# Patient Record
Sex: Female | Born: 1960 | Race: White | Hispanic: No | Marital: Single | Smoking: Never smoker
Health system: Southern US, Community
[De-identification: ages and names within clinical notes are randomized; demographics above are authoritative.]

## PROBLEM LIST (undated history)

## (undated) DIAGNOSIS — Q211 Atrial septal defect: Secondary | ICD-10-CM

## (undated) DIAGNOSIS — F419 Anxiety disorder, unspecified: Secondary | ICD-10-CM

## (undated) DIAGNOSIS — M1990 Unspecified osteoarthritis, unspecified site: Secondary | ICD-10-CM

## (undated) DIAGNOSIS — N809 Endometriosis, unspecified: Secondary | ICD-10-CM

## (undated) DIAGNOSIS — Q225 Ebstein's anomaly: Secondary | ICD-10-CM

## (undated) DIAGNOSIS — Q2112 Patent foramen ovale: Secondary | ICD-10-CM

## (undated) HISTORY — DX: Atrial septal defect: Q21.1

## (undated) HISTORY — DX: Ebstein's anomaly: Q22.5

## (undated) HISTORY — DX: Endometriosis, unspecified: N80.9

## (undated) HISTORY — PX: BREAST BIOPSY: SHX20

## (undated) HISTORY — PX: BREAST EXCISIONAL BIOPSY: SUR124

## (undated) HISTORY — PX: LASER LAPAROSCOPY: SHX1952

## (undated) HISTORY — DX: Anxiety disorder, unspecified: F41.9

## (undated) HISTORY — DX: Patent foramen ovale: Q21.12

## (undated) HISTORY — DX: Unspecified osteoarthritis, unspecified site: M19.90

## (undated) HISTORY — PX: JOINT REPLACEMENT: SHX530

---

## 2017-10-01 LAB — BASIC METABOLIC PANEL
BUN: 12 (ref 4–21)
CREATININE: 0.9 (ref 0.5–1.1)
Glucose: 109
Potassium: 4.2 (ref 3.4–5.3)
Sodium: 143 (ref 137–147)

## 2017-10-01 LAB — HEPATIC FUNCTION PANEL
ALT: 19 (ref 7–35)
AST: 28 (ref 13–35)
Alkaline Phosphatase: 88 (ref 25–125)
Bilirubin, Total: 0.5

## 2017-10-01 LAB — HEMOGLOBIN A1C: Hemoglobin A1C: 5.9

## 2017-10-01 LAB — TSH: TSH: 1.28 (ref 0.41–5.90)

## 2017-11-13 LAB — HM COLONOSCOPY

## 2017-12-01 HISTORY — PX: WRIST SURGERY: SHX841

## 2018-02-26 LAB — HM MAMMOGRAPHY

## 2018-03-05 LAB — HM PAP SMEAR: HM Pap smear: NEGATIVE

## 2018-12-06 ENCOUNTER — Encounter: Payer: Self-pay | Admitting: Physician Assistant

## 2018-12-06 ENCOUNTER — Ambulatory Visit: Payer: 59 | Admitting: Physician Assistant

## 2018-12-06 VITALS — BP 122/80 | HR 76 | Temp 98.3°F | Ht 69.0 in | Wt 261.5 lb

## 2018-12-06 DIAGNOSIS — Q2112 Patent foramen ovale: Secondary | ICD-10-CM

## 2018-12-06 DIAGNOSIS — Q225 Ebstein's anomaly: Secondary | ICD-10-CM | POA: Diagnosis not present

## 2018-12-06 DIAGNOSIS — Q211 Atrial septal defect: Secondary | ICD-10-CM

## 2018-12-06 DIAGNOSIS — F419 Anxiety disorder, unspecified: Secondary | ICD-10-CM

## 2018-12-06 MED ORDER — CITALOPRAM HYDROBROMIDE 20 MG PO TABS: 20.0000 mg | ORAL_TABLET | Freq: Every day | ORAL | 1 refills | Status: DC

## 2018-12-06 NOTE — Progress Notes (Signed)
Susan Bender is a 58 y.o. female here to Establish Care.  I acted as a Neurosurgeonscribe for [employer] East CorporationSamantha Tacy Chavis, PA-C Susan Mullonna Orphanos, LPN  History of Present Illness:   Chief Complaint  Patient presents with  . Establish Care  . 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 Last echo was about 2-3 years ago [place]. Needs new cardiologist since she has moved here. Anxiety -- currently on celexa 20 mg. Feels like this is working well for her.  Health Maintenance: Immunizations -- UTD Colonoscopy -- done 10/2018 Normal, repeat 10 years. Mammogram -- done 01/2018 Normal PAP -- 03/05/2018 NILM, No Hx Abnormal paps Bone Density -- done 5-6 years ago normal per pt. Weight -- Weight: 261 lb 8 oz (118.6 kg)   Depression screen [facility] 12/06/2018  Decreased Interest 0  Down, Depressed, Hopeless 0  PHQ - 2 Score 0    GAD 7 : Generalized Anxiety Score 12/06/2018  Nervous, Anxious, on Edge 0  Control/stop worrying 0  Worry too much - different things 1  Trouble relaxing 0  Restless 0  Easily annoyed or irritable 0  Afraid - awful might happen 0  Total GAD 7 Score 1  Anxiety Difficulty Not difficult at all     Other providers/specialists: Patient Care Team: Susan MottoWorley, Susan Ansell, [place] as PCP - General (Physician Assistant)   Past Medical History:  Diagnosis Date  . Anxiety   . 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 Endometriosis   . PFO (patent foramen ovale)      Social History   Socioeconomic History  . Marital status: Not on file    Spouse name: Not on file  . Number of children: Not on file  . Years of education: Not on file  . Highest education level: Not on file  Occupational History  . Not on file  Social Needs  . Financial resource strain: Not on file  . Food insecurity:    Worry: Not on file    Inability: Not on file  . Transportation needs:    Medical: Not on file    Non-medical:  Not on file  Tobacco Use  . Smoking status: Never Smoker  . Smokeless tobacco: Never Used  Substance and Sexual Activity  . Alcohol use: Yes    Comment: occassional glass of wine  . Drug use: Never  . Sexual activity: Not Currently  Lifestyle  . Physical activity:    Days per week: Not on file    Minutes per session: Not on file  . Stress: Not on file  Relationships  . Social connections:    Talks on phone: Not on file    Gets together: Not on file    Attends religious service: Not on file    Active member of club or organization: Not on file    Attends meetings of clubs or organizations: Not on file    Relationship status: Not on file  . Intimate partner violence:    Fear of current or ex partner: Not on file    Emotionally abused: Not on file    Physically abused: Not on file    Forced sexual activity: Not on file  Other Topics Concern  . Not on file  Social History Narrative   Surgical tech at [employer]   Moved to [place] to be closer to mother and sister  No children    Past Surgical History:  Procedure Laterality Date  . LASER LAPAROSCOPY    . WRIST SURGERY Right 2019    History reviewed. No pertinent family history.  Allergies  Allergen Reactions  . Sulfa Antibiotics Hives     Current Medications:   Current Outpatient Medications:  .  cimetidine (TAGAMET) 200 MG tablet, Take 200 mg by mouth daily as needed., Disp: , Rfl:  .  citalopram (CELEXA) 20 MG tablet, Take 1 tablet (20 mg total) by mouth daily., Disp: 90 tablet, Rfl: 1 .  Multiple Vitamins-Minerals (ONE-A-DAY WOMENS VITACRAVES) CHEW, Chew 1 each by mouth daily., Disp: , Rfl:  .  naproxen sodium (ALEVE) 220 MG tablet, Take 220 mg by mouth as needed., Disp: , Rfl:    Review of Systems:   ROS  Negative unless otherwise specified per HPI.  Vitals:   Vitals:   12/06/18 0813  BP: 122/80  Pulse: 76  Temp: 98.3 F (36.8 C)  TempSrc: Oral  SpO2: 97%  Weight: 261 lb 8 oz (118.6 kg)  Height: 5\' 9"   (1.753 m)      Body mass index is 38.62 kg/m.  Physical Exam:   Physical Exam Vitals signs and nursing note reviewed.  Constitutional:      General: She is not in acute distress.    Appearance: She is well-developed. She is not ill-appearing or toxic-appearing.  Cardiovascular:     Rate and Rhythm: Normal rate and regular rhythm.     Pulses: Normal pulses.     Heart sounds: Normal heart sounds, S1 normal and S2 normal.     Comments: No LE edema Pulmonary:     Effort: Pulmonary effort is normal.     Breath sounds: Normal breath sounds.  Skin:    General: Skin is warm and dry.  Neurological:     Mental Status: She is alert.     GCS: GCS eye subscore is 4. GCS verbal subscore is 5. GCS motor subscore is 6.  Psychiatric:        Speech: Speech normal.        Behavior: Behavior normal. Behavior is cooperative.     No results found for this or any previous visit.  Assessment and Plan:   Susan Bender was seen today for establish care and annual exam.  Diagnoses and all orders for this visit:  Ebstein anomaly and PFO (patent foramen ovale) -     Ambulatory referral to Cardiology  Anxiety Refill Celexa.  Other orders -     citalopram (CELEXA) 20 MG tablet; Take 1 tablet (20 mg total) by mouth daily.  . Reviewed expectations re: course of current medical issues. . Discussed self-management of symptoms. . Outlined signs and symptoms indicating need for more acute intervention. . Patient verbalized understanding and all questions were answered. . See orders for this visit as documented in the electronic medical record. . Patient received an After-Visit Summary.  CMA or LPN served as scribe during this visit. History, Physical, and Plan performed by medical provider. The above documentation has been reviewed and is accurate and complete.  Susan MottoSamantha Tayla Panozzo, PA-C

## 2018-12-06 NOTE — Patient Instructions (Signed)
It was great to meet you!  Please make an appointment for a physical on your way out.  I have refilled your celexa.  You will be contacted about your cardiology referral.  Take care,  Jarold MottoSamantha Kathie Posa PA-C

## 2018-12-10 ENCOUNTER — Encounter: Payer: Self-pay | Admitting: Physical Therapy

## 2018-12-14 ENCOUNTER — Encounter: Payer: Self-pay | Admitting: Physician Assistant

## 2018-12-14 ENCOUNTER — Ambulatory Visit (INDEPENDENT_AMBULATORY_CARE_PROVIDER_SITE_OTHER): Payer: 59 | Admitting: Physician Assistant

## 2018-12-14 VITALS — BP 120/70 | HR 74 | Temp 98.1°F | Ht 69.0 in | Wt 262.0 lb

## 2018-12-14 DIAGNOSIS — Q2112 Patent foramen ovale: Secondary | ICD-10-CM

## 2018-12-14 DIAGNOSIS — E669 Obesity, unspecified: Secondary | ICD-10-CM

## 2018-12-14 DIAGNOSIS — Z1159 Encounter for screening for other viral diseases: Secondary | ICD-10-CM

## 2018-12-14 DIAGNOSIS — Z0000 Encounter for general adult medical examination without abnormal findings: Secondary | ICD-10-CM | POA: Diagnosis not present

## 2018-12-14 DIAGNOSIS — K219 Gastro-esophageal reflux disease without esophagitis: Secondary | ICD-10-CM | POA: Diagnosis not present

## 2018-12-14 DIAGNOSIS — Q225 Ebstein's anomaly: Secondary | ICD-10-CM | POA: Diagnosis not present

## 2018-12-14 DIAGNOSIS — F419 Anxiety disorder, unspecified: Secondary | ICD-10-CM | POA: Diagnosis not present

## 2018-12-14 DIAGNOSIS — Z136 Encounter for screening for cardiovascular disorders: Secondary | ICD-10-CM | POA: Diagnosis not present

## 2018-12-14 DIAGNOSIS — N809 Endometriosis, unspecified: Secondary | ICD-10-CM | POA: Insufficient documentation

## 2018-12-14 DIAGNOSIS — Z13220 Encounter for screening for lipoid disorders: Secondary | ICD-10-CM | POA: Diagnosis not present

## 2018-12-14 DIAGNOSIS — R7309 Other abnormal glucose: Secondary | ICD-10-CM | POA: Diagnosis not present

## 2018-12-14 DIAGNOSIS — Z114 Encounter for screening for human immunodeficiency virus [HIV]: Secondary | ICD-10-CM

## 2018-12-14 DIAGNOSIS — Q211 Atrial septal defect: Secondary | ICD-10-CM

## 2018-12-14 LAB — COMPREHENSIVE METABOLIC PANEL
ALT: 15 U/L (ref 0–35)
AST: 18 U/L (ref 0–37)
Albumin: 3.9 g/dL (ref 3.5–5.2)
Alkaline Phosphatase: 77 U/L (ref 39–117)
BILIRUBIN TOTAL: 0.5 mg/dL (ref 0.2–1.2)
BUN: 17 mg/dL (ref 6–23)
CO2: 26 mEq/L (ref 19–32)
Calcium: 9.3 mg/dL (ref 8.4–10.5)
Chloride: 107 mEq/L (ref 96–112)
Creatinine, Ser: 0.88 mg/dL (ref 0.40–1.20)
GFR: 70.06 mL/min (ref >60.00)
Glucose, Bld: 91 mg/dL (ref 70–99)
Potassium: 4.3 mEq/L (ref 3.5–5.1)
Sodium: 140 mEq/L (ref 135–145)
TOTAL PROTEIN: 6.6 g/dL (ref 6.0–8.3)

## 2018-12-14 LAB — LIPID PANEL
Cholesterol: 215 mg/dL — ABNORMAL HIGH (ref 0–200)
HDL: 60.3 mg/dL (ref >39.00)
LDL Cholesterol: 144 mg/dL — ABNORMAL HIGH (ref 0–99)
NonHDL: 154.78
Total CHOL/HDL Ratio: 4
Triglycerides: 54 mg/dL (ref 0.0–149.0)
VLDL: 10.8 mg/dL (ref 0.0–40.0)

## 2018-12-14 LAB — CBC WITH DIFFERENTIAL/PLATELET
Basophils Absolute: 0 10*3/uL (ref 0.0–0.1)
Basophils Relative: 1.2 % (ref 0.0–3.0)
Eosinophils Absolute: 0.1 10*3/uL (ref 0.0–0.7)
Eosinophils Relative: 3.3 % (ref 0.0–5.0)
HCT: 40.9 % (ref 36.0–46.0)
Hemoglobin: 13.7 g/dL (ref 12.0–15.0)
Lymphocytes Relative: 31.2 % (ref 12.0–46.0)
Lymphs Abs: 1.3 10*3/uL (ref 0.7–4.0)
MCHC: 33.6 g/dL (ref 30.0–36.0)
MCV: 92.1 fl (ref 78.0–100.0)
Monocytes Absolute: 0.3 10*3/uL (ref 0.1–1.0)
Monocytes Relative: 8.2 % (ref 3.0–12.0)
NEUTROS ABS: 2.4 10*3/uL (ref 1.4–7.7)
Neutrophils Relative %: 56.1 % (ref 43.0–77.0)
Platelets: 236 10*3/uL (ref 150.0–400.0)
RBC: 4.44 Mil/uL (ref 3.87–5.11)
RDW: 13.3 % (ref 11.5–15.5)
WBC: 4.2 10*3/uL (ref 4.0–10.5)

## 2018-12-14 LAB — HEMOGLOBIN A1C: Hgb A1c MFr Bld: 5.9 % (ref 4.6–6.5)

## 2018-12-14 NOTE — Patient Instructions (Signed)
It was great to see you!  Please go to the lab for blood work.   Our office will call you with your results unless you have chosen to receive results via MyChart.  If your blood work is normal we will follow-up each year for physicals and as scheduled for chronic medical problems.  If anything is abnormal we will treat accordingly and get you in for a follow-up.  Take care,  [facility] Maintenance, Female Adopting a healthy lifestyle and getting preventive care can go a long way to promote health and wellness. Talk with your health care provider about what schedule of regular examinations is right for you. This is a good chance for you to check in with your provider about disease prevention and staying healthy. In between checkups, there are plenty of things you can do on your own. Experts have done a lot of research about which lifestyle changes and preventive measures are most likely to keep you healthy. Ask your health care provider for more information. Weight and diet Eat a healthy diet  Be sure to include plenty of vegetables, fruits, low-fat dairy products, and lean protein.  Do not eat a lot of foods high in solid fats, added sugars, or salt.  Get regular exercise. This is one of the most important things you can do for your health. ? Most adults should exercise for at least 150 minutes each week. The exercise should increase your heart rate and make you sweat (moderate-intensity exercise). ? Most adults should also do strengthening exercises at least twice a week. This is in addition to the moderate-intensity exercise. Maintain a healthy weight  Body mass index (BMI) is a measurement that can be used to identify possible weight problems. It estimates body fat based on height and weight. Your health care provider can help determine your BMI and help you achieve or maintain a healthy weight.  For females 55 years of age and older: ? A BMI below 18.5 is considered  underweight. ? A BMI of 18.5 to 24.9 is normal. ? A BMI of 25 to 29.9 is considered overweight. ? A BMI of 30 and above is considered obese. Watch levels of cholesterol and blood lipids  You should start having your blood tested for lipids and cholesterol at 60 years of age, then have this test every 5 years.  You may need to have your cholesterol levels checked more often if: ? Your lipid or cholesterol levels are high. ? You are older than 59 years of age. ? You are at high risk for heart disease. Cancer screening Lung Cancer  Lung cancer screening is recommended for adults 60-73 years old who are at high risk for lung cancer because of a history of smoking.  A yearly low-dose CT scan of the lungs is recommended for people who: ? Currently smoke. ? Have quit within the past 15 years. ? Have at least a 30-pack-year history of smoking. A pack year is smoking an average of one pack of cigarettes a day for 1 year.  Yearly screening should continue until it has been 15 years since you quit.  Yearly screening should stop if you develop a health problem that would prevent you from having lung cancer treatment. Breast Cancer  Practice breast self-awareness. This means understanding how your breasts normally appear and feel.  It also means doing regular breast self-exams. Let your health care provider know about any changes, no matter how small.  If you are in  your 20s or 30s, you should have a clinical breast exam (CBE) by a health care provider every 1-3 years as part of a regular health exam.  If you are 40 or older, have a CBE every year. Also consider having a breast X-ray (mammogram) every year.  If you have a family history of breast cancer, talk to your health care provider about genetic screening.  If you are at high risk for breast cancer, talk to your health care provider about having an MRI and a mammogram every year.  Breast cancer gene (BRCA) assessment is recommended  for women who have family members with BRCA-related cancers. BRCA-related cancers include: ? Breast. ? Ovarian. ? Tubal. ? Peritoneal cancers.  Results of the assessment will determine the need for genetic counseling and BRCA1 and BRCA2 testing. Cervical Cancer Your health care provider may recommend that you be screened regularly for cancer of the pelvic organs (ovaries, uterus, and vagina). This screening involves a pelvic examination, including checking for microscopic changes to the surface of your cervix (Pap test). You may be encouraged to have this screening done every 3 years, beginning at age 21.  For women ages 30-65, health care providers may recommend pelvic exams and Pap testing every 3 years, or they may recommend the Pap and pelvic exam, combined with testing for human papilloma virus (HPV), every 5 years. Some types of HPV increase your risk of cervical cancer. Testing for HPV may also be done on women of any age with unclear Pap test results.  Other health care providers may not recommend any screening for nonpregnant women who are considered low risk for pelvic cancer and who do not have symptoms. Ask your health care provider if a screening pelvic exam is right for you.  If you have had past treatment for cervical cancer or a condition that could lead to cancer, you need Pap tests and screening for cancer for at least 20 years after your treatment. If Pap tests have been discontinued, your risk factors (such as having a new sexual partner) need to be reassessed to determine if screening should resume. Some women have medical problems that increase the chance of getting cervical cancer. In these cases, your health care provider may recommend more frequent screening and Pap tests. Colorectal Cancer  This type of cancer can be detected and often prevented.  Routine colorectal cancer screening usually begins at 59 years of age and continues through 59 years of age.  Your health  care provider may recommend screening at an earlier age if you have risk factors for colon cancer.  Your health care provider may also recommend using home test kits to check for hidden blood in the stool.  A small camera at the end of a tube can be used to examine your colon directly (sigmoidoscopy or colonoscopy). This is done to check for the earliest forms of colorectal cancer.  Routine screening usually begins at age 50.  Direct examination of the colon should be repeated every 5-10 years through 59 years of age. However, you may need to be screened more often if early forms of precancerous polyps or small growths are found. Skin Cancer  Check your skin from head to toe regularly.  Tell your health care provider about any new moles or changes in moles, especially if there is a change in a mole's shape or color.  Also tell your health care provider if you have a mole that is larger than the size of a pencil   eraser.  Always use sunscreen. Apply sunscreen liberally and repeatedly throughout the day.  Protect yourself by wearing long sleeves, pants, a wide-brimmed hat, and sunglasses whenever you are outside. Heart disease, diabetes, and high blood pressure  High blood pressure causes heart disease and increases the risk of stroke. High blood pressure is more likely to develop in: ? People who have blood pressure in the high end of the normal range (130-139/85-89 mm Hg). ? People who are overweight or obese. ? People who are African American.  If you are 68-37 years of age, have your blood pressure checked every 3-5 years. If you are 48 years of age or older, have your blood pressure checked every year. You should have your blood pressure measured twice-once when you are at a hospital or clinic, and once when you are not at a hospital or clinic. Record the average of the two measurements. To check your blood pressure when you are not at a hospital or clinic, you can use: ? An automated  blood pressure machine at a pharmacy. ? A home blood pressure monitor.  If you are between 92 years and 57 years old, ask your health care provider if you should take aspirin to prevent strokes.  Have regular diabetes screenings. This involves taking a blood sample to check your fasting blood sugar level. ? If you are at a normal weight and have a low risk for diabetes, have this test once every three years after 59 years of age. ? If you are overweight and have a high risk for diabetes, consider being tested at a younger age or more often. Preventing infection Hepatitis B  If you have a higher risk for hepatitis B, you should be screened for this virus. You are considered at high risk for hepatitis B if: ? You were born in a country where hepatitis B is common. Ask your health care provider which countries are considered high risk. ? Your parents were born in a high-risk country, and you have not been immunized against hepatitis B (hepatitis B vaccine). ? You have HIV or AIDS. ? You use needles to inject street drugs. ? You live with someone who has hepatitis B. ? You have had sex with someone who has hepatitis B. ? You get hemodialysis treatment. ? You take certain medicines for conditions, including cancer, organ transplantation, and autoimmune conditions. Hepatitis C  Blood testing is recommended for: ? Everyone born from [birth year] through 1966. ? Anyone with known risk factors for hepatitis C. Sexually transmitted infections (STIs)  You should be screened for sexually transmitted infections (STIs) including gonorrhea and chlamydia if: ? You are sexually active and are younger than 59 years of age. ? You are older than 59 years of age and your health care provider tells you that you are at risk for this type of infection. ? Your sexual activity has changed since you were last screened and you are at an increased risk for chlamydia or gonorrhea. Ask your health care provider if you are at  risk.  If you do not have HIV, but are at risk, it may be recommended that you take a prescription medicine daily to prevent HIV infection. This is called pre-exposure prophylaxis (PrEP). You are considered at risk if: ? You are sexually active and do not regularly use condoms or know the HIV status of your partner(s). ? You take drugs by injection. ? You are sexually active with a partner who has HIV. Talk with your health  care provider about whether you are at high risk of being infected with HIV. If you choose to begin PrEP, you should first be tested for HIV. You should then be tested every 3 months for as long as you are taking PrEP. Pregnancy  If you are premenopausal and you may become pregnant, ask your health care provider about preconception counseling.  If you may become pregnant, take 400 to 800 micrograms (mcg) of folic acid every day.  If you want to prevent pregnancy, talk to your health care provider about birth control (contraception). Osteoporosis and menopause  Osteoporosis is a disease in which the bones lose minerals and strength with aging. This can result in serious bone fractures. Your risk for osteoporosis can be identified using a bone density scan.  If you are 65 years of age or older, or if you are at risk for osteoporosis and fractures, ask your health care provider if you should be screened.  Ask your health care provider whether you should take a calcium or vitamin D supplement to lower your risk for osteoporosis.  Menopause may have certain physical symptoms and risks.  Hormone replacement therapy may reduce some of these symptoms and risks. Talk to your health care provider about whether hormone replacement therapy is right for you. Follow these instructions at home:  Schedule regular health, dental, and eye exams.  Stay current with your immunizations.  Do not use any tobacco products including cigarettes, chewing tobacco, or electronic  cigarettes.  If you are pregnant, do not drink alcohol.  If you are breastfeeding, limit how much and how often you drink alcohol.  Limit alcohol intake to no more than 1 drink per day for nonpregnant women. One drink equals 12 ounces of beer, 5 ounces of wine, or 1 ounces of hard liquor.  Do not use street drugs.  Do not share needles.  Ask your health care provider for help if you need support or information about quitting drugs.  Tell your health care provider if you often feel depressed.  Tell your health care provider if you have ever been abused or do not feel safe at home. This information is not intended to replace advice given to you by your health care provider. Make sure you discuss any questions you have with your health care provider. Document Released: 06/02/2011 Document Revised: 04/24/2016 Document Reviewed: 08/21/2015 [employer]vier Interactive Patient Education  2019 [employer]  

## 2018-12-14 NOTE — Progress Notes (Signed)
I acted as a Neurosurgeonscribe for Energy East CorporationSamantha Chenise Mulvihill, PA-C Susan Mullonna Orphanos, LPN   Subjective:    Susan RuddyKimberly J Bender is a 58 y.o. female and is here for a comprehensive physical exam.   HPI  Health Maintenance Due  Topic Date Due  . Hepatitis C Screening  1961-03-31  . HIV Screening  08/01/1975  . COLONOSCOPY  07/31/2010    Acute Concerns: None  Chronic Issues: Ebstein anomaly/PFO -- has new patient cardiology appointment next month; denies chest pain, SOB, palpitations or other concerns presently Anxiety -- currently well controlled on Celexa 20 mg GERD -- takes tagamet 200 mg daily, overall well controlled Insulin resistance/obesity -- hx of HgbA1c of 5.9. Does have family hx DM. Has had weight gain. Reluctant to try metformin  Health Maintenance: Immunizations -- UTD Colonoscopy -- UTD, awaiting copy of results Mammogram -- UTD, awaiting copy of results PAP -- UTD, awaiting copy of results Bone Density -- n/a Diet -- doesn't eat well, tries to eat all food groups Sleep habits -- fair, no concerns for sleep apnea presently Exercise -- used to do yoga, but currently not enrolled in anything Weight -- Weight: 262 lb (118.8 kg) ; her highest weight ever Mood -- good Weight history: Wt Readings from Last 10 Encounters:  12/14/18 262 lb (118.8 kg)  12/06/18 261 lb 8 oz (118.6 kg)   No LMP recorded. Patient is postmenopausal. Period characteristics: none Alcohol use: none concerning Tobacco use: none  Depression screen PHQ 2/9 12/06/2018  Decreased Interest 0  Down, Depressed, Hopeless 0  PHQ - 2 Score 0     Other providers/specialists: Patient Care Team: Jarold MottoWorley, Susan Herter, [place] as PCP - General (Physician Assistant)    PMHx, SurgHx, SocialHx, Medications, and Allergies were reviewed in the Visit Navigator and updated as appropriate.   Past Medical History:  Diagnosis Date  . Anxiety   . 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 Endometriosis   . PFO (patent foramen  ovale)      Past Surgical History:  Procedure Laterality Date  . LASER LAPAROSCOPY    . WRIST SURGERY Right 2019     Family History  Problem Relation Age of Onset  . Arthritis Mother   . Alzheimer's disease Mother   . Diabetes Father   . Heart attack Father   . Heart disease Father   . Hypertension Father   . Parkinson's disease Father   . COPD Sister   . Diabetes Sister   . Hypertension Sister   . Heart attack Paternal Grandfather   . Heart disease Paternal Grandfather   . Asthma Sister     Social History   Tobacco Use  . Smoking status: Never Smoker  . Smokeless tobacco: Never Used  Substance Use Topics  . Alcohol use: Yes    Comment: occassional glass of wine  . Drug use: Never    Review of Systems:   Review of Systems  Constitutional: Negative.  Negative for chills, fever, malaise/fatigue and weight loss.  HENT: Negative.  Negative for hearing loss, sinus pain and sore throat.   Eyes: Negative.  Negative for blurred vision.  Respiratory: Negative.  Negative for cough and shortness of breath.   Cardiovascular: Negative.  Negative for chest pain, palpitations and leg swelling.  Gastrointestinal: Negative.  Negative for abdominal pain, constipation, diarrhea, heartburn, nausea and vomiting.  Genitourinary: Negative.  Negative for dysuria, frequency and urgency.  Musculoskeletal: Positive for back pain (Chronic). Negative for myalgias and neck  pain.  Skin: Negative.  Negative for itching and rash.  Neurological: Negative.  Negative for dizziness, tingling, seizures, loss of consciousness and headaches.  Endo/Heme/Allergies: Negative for polydipsia.  Psychiatric/Behavioral: Negative.  Negative for depression. The patient is not nervous/anxious.     Objective:   BP 120/70 (BP Location: Left Arm, Patient Position: Sitting, Cuff Size: Large)   Pulse 74   Temp 98.1 F (36.7 C) (Oral)   Ht 5\' 9"  (1.753 m)   Wt 262 lb (118.8 kg)   SpO2 95%   BMI 38.69 kg/m    Body mass index is 38.69 kg/m.   General Appearance:    Alert, cooperative, no distress, appears stated age  Head:    Normocephalic, without obvious abnormality, atraumatic  Eyes:    PERRL, conjunctiva/corneas clear, EOM's intact, fundi    benign, both eyes  Ears:    Normal TM's and external ear canals, both ears  Nose:   Nares normal, septum midline, mucosa normal, no drainage    or sinus tenderness  Throat:   Lips, mucosa, and tongue normal; teeth and gums normal  Neck:   Supple, symmetrical, trachea midline, no adenopathy;    thyroid:  no enlargement/tenderness/nodules; no carotid   bruit or JVD  Back:     Symmetric, no curvature, ROM normal, no CVA tenderness  Lungs:     Clear to auscultation bilaterally, respirations unlabored  Chest Wall:    No tenderness or deformity   Heart:    Regular rate and rhythm, S1 and S2 normal, no murmur, rub or gallop  Breast Exam:    No tenderness, masses, or nipple abnormality  Abdomen:     Soft, non-tender, bowel sounds active all four quadrants,    no masses, no organomegaly  Genitalia:    Deferred  Extremities:   Extremities normal, atraumatic, no cyanosis or edema  Pulses:   2+ and symmetric all extremities  Skin:   Skin color, texture, turgor normal, no rashes or lesions  Lymph nodes:   Cervical, supraclavicular, and axillary nodes normal  Neurologic:   CNII-XII intact, normal strength, sensation and reflexes    throughout    Assessment/Plan:   Susan Bender was seen today for annual exam.  Diagnoses and all orders for this visit:  Routine physical examination Today patient counseled on age appropriate routine health concerns for screening and prevention, each reviewed and up to date or declined. Immunizations reviewed and up to date or declined. Labs ordered and reviewed. Risk factors for depression reviewed and negative. Hearing function and visual acuity are intact. ADLs screened and addressed as needed. Functional ability and level of  safety reviewed and appropriate. Education, counseling and referrals performed based on assessed risks today. Patient provided with a copy of personalized plan for preventive services.  Anxiety Currently on Celexa 20 mg. Stable. Follow-up in 6 months, sooner if needed. -     CBC with Differential/Platelet -     Comprehensive metabolic panel  Obesity (BMI 30-39.9) and Elevated glucose Encouraged weight loss. Offered nutrition counseling, she declined. She will consider metformin if HgbA1c remains elevated.  -     Hemoglobin A1c  PFO (patent foramen ovale) and Ebstein anomaly Upcoming cardiology appointment pending. Currently asymptomatic.  Gastroesophageal reflux disease, esophagitis presence not specified Currently well controlled with Tagamet 200 mg.  Encounter for lipid screening for cardiovascular disease -     Lipid panel  Encounter for screening for other viral diseases -     Hepatitis C antibody  Screening for  HIV (human immunodeficiency virus) -     HIV Antibody (routine testing w rflx)   Well Adult Exam: Labs ordered: Yes. Patient counseling was done. See below for items discussed. Discussed the patient's BMI.  The BMI is not in the acceptable range; BMI management plan is completed Follow up pending results. Breast cancer screening: UTD. Cervical cancer screening: UTD   Patient Counseling: [x]    Nutrition: Stressed importance of moderation in sodium/caffeine intake, saturated fat and cholesterol, caloric balance, sufficient intake of fresh fruits, vegetables, fiber, calcium, iron, and 1 mg of folate supplement per day (for females capable of pregnancy).  [x]    Stressed the importance of regular exercise.   [x]    Substance Abuse: Discussed cessation/primary prevention of tobacco, alcohol, or other drug use; driving or other dangerous activities under the influence; availability of treatment for abuse.   [x]    Injury prevention: Discussed safety belts, safety helmets, smoke  detector, smoking near bedding or upholstery.   []    Sexuality: Discussed sexually transmitted diseases, partner selection, use of condoms, avoidance of unintended pregnancy  and contraceptive alternatives.  [x]    Dental health: Discussed importance of regular tooth brushing, flossing, and dental visits.  [x]    Health maintenance and immunizations reviewed. Please refer to Health maintenance section.   CMA or LPN served as scribe during this visit. History, Physical, and Plan performed by medical provider. The above documentation has been reviewed and is accurate and complete.  Jarold Motto, PA-C [facility]

## 2018-12-15 LAB — HEPATITIS C ANTIBODY
Hepatitis C Ab: NONREACTIVE
SIGNAL TO CUT-OFF: 0.01 (ref <1.00)

## 2018-12-15 LAB — HIV ANTIBODY (ROUTINE TESTING W REFLEX): HIV 1&2 Ab, 4th Generation: NONREACTIVE

## 2018-12-29 ENCOUNTER — Encounter: Payer: Self-pay | Admitting: Physician Assistant

## 2018-12-30 ENCOUNTER — Ambulatory Visit: Payer: 59 | Admitting: Cardiology

## 2019-01-04 DIAGNOSIS — H5213 Myopia, bilateral: Secondary | ICD-10-CM | POA: Diagnosis not present

## 2019-01-04 DIAGNOSIS — H524 Presbyopia: Secondary | ICD-10-CM | POA: Diagnosis not present

## 2019-01-04 DIAGNOSIS — H18413 Arcus senilis, bilateral: Secondary | ICD-10-CM | POA: Diagnosis not present

## 2019-01-04 DIAGNOSIS — H52223 Regular astigmatism, bilateral: Secondary | ICD-10-CM | POA: Diagnosis not present

## 2019-01-18 ENCOUNTER — Ambulatory Visit: Payer: 59 | Admitting: Cardiology

## 2019-01-25 ENCOUNTER — Encounter: Payer: Self-pay | Admitting: Cardiology

## 2019-01-25 ENCOUNTER — Ambulatory Visit: Payer: 59 | Admitting: Cardiology

## 2019-01-25 VITALS — BP 118/84 | HR 80 | Ht 70.0 in | Wt 267.4 lb

## 2019-01-25 DIAGNOSIS — Z6838 Body mass index (BMI) 38.0-38.9, adult: Secondary | ICD-10-CM

## 2019-01-25 DIAGNOSIS — Q225 Ebstein's anomaly: Secondary | ICD-10-CM

## 2019-01-25 DIAGNOSIS — Z7189 Other specified counseling: Secondary | ICD-10-CM | POA: Diagnosis not present

## 2019-01-25 DIAGNOSIS — Q211 Atrial septal defect: Secondary | ICD-10-CM

## 2019-01-25 DIAGNOSIS — Z713 Dietary counseling and surveillance: Secondary | ICD-10-CM

## 2019-01-25 DIAGNOSIS — Q2112 Patent foramen ovale: Secondary | ICD-10-CM

## 2019-01-25 DIAGNOSIS — E6609 Other obesity due to excess calories: Secondary | ICD-10-CM

## 2019-01-25 NOTE — Patient Instructions (Signed)

## 2019-01-25 NOTE — Progress Notes (Signed)
Cardiology Office Note:    Date:  01/25/2019   ID:  Susan, Bender 09/04/1961, MRN [id number]  PCP:  Jarold Motto, PA  Cardiologist:  Jodelle Red, MD PhD  Referring MD: Jarold Motto, [place]   CC. Establish care  History of Present Illness:    Susan Bender is a 58 y.o. female with a hx of Ebstein's anomaly who is seen as a new consult at the request of Jarold Motto, [place] for the evaluation and management of Ebstein's.  I received a copy of a clinic note from 06/06/2016 (Dr. Griffith Citron), and I also requested her [facility] records from Care Everywhere.   Reports that she had an echo at her last visit with Dr. Griffith Citron in [place], [place] in 2017. Will request records of this, as they are not included. Had her sign the release today.   Overall doing well. Walks routinely, thought notes that it's "not enough." Gets some shortness of breath on occasion, only with exertion. Denies chest pain, shortness of breath at rest. No PND, orthopnea, or unexpected weight gain. No syncope, rare palpitations, worse with caffeine. Rare LE edema, worse when on her feet all day.  Cardiac history: Diagnosed in her 1s, no prior cardiac history. 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 Checked on an ECG, saw fast rhythm, went to see provider. Had holter monitor placed and was placed on medication by an NP. (per Dr. Orie Rout notes, Holter showed isolated PACs and PVCs, was placed on beta blocker). Felt terrible on medication, referred to Dr. Griffith Citron in [place], [place]. Had normal stress test, then had echo that showed Ebstein's. Was referred to Dr. Deatra James at the [facility], has one visit there. Was followed annually by Dr. Griffith Citron with echo every 3-5 years.   Past Medical History:  Diagnosis Date  . Anxiety   . Ebstein anomaly    diagnosed in age 9's  . Endometriosis   . PFO (patent foramen ovale)     Past  Surgical History:  Procedure Laterality Date  . LASER LAPAROSCOPY    . WRIST SURGERY Right 2019    Current Medications: Current Outpatient Medications on File Prior to Visit  Medication Sig  . citalopram (CELEXA) 20 MG tablet Take 1 tablet (20 mg total) by mouth daily.  . Multiple Vitamins-Minerals (ONE-A-DAY WOMENS VITACRAVES) CHEW Chew 1 each by mouth daily.  . cimetidine (TAGAMET) 200 MG tablet Take 200 mg by mouth daily as needed.  . naproxen sodium (ALEVE) 220 MG tablet Take 220 mg by mouth as needed.   No current facility-administered medications on file prior to visit.      Allergies:   Sulfa antibiotics   Social History   Socioeconomic History  . Marital status: Single    Spouse name: Not on file  . Number of children: Not on file  . Years of education: Not on file  . Highest education level: Not on file  Occupational History  . Not on file  Social Needs  . Financial resource strain: Not on file  . Food insecurity:    Worry: Not on file    Inability: Not on file  . Transportation needs:    Medical: Not on file    Non-medical: Not on file  Tobacco Use  . Smoking status: Never Smoker  . Smokeless tobacco: Never Used  Substance and Sexual Activity  . Alcohol use: Yes  Comment: occassional glass of wine  . Drug use: Never  . Sexual activity: Not Currently  Lifestyle  . Physical activity:    Days per week: Not on file    Minutes per session: Not on file  . Stress: Not on file  Relationships  . Social connections:    Talks on phone: Not on file    Gets together: Not on file    Attends religious service: Not on file    Active member of club or organization: Not on file    Attends meetings of clubs or organizations: Not on file    Relationship status: Not on file  Other Topics Concern  . 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 Dad had MI and CABG in his late 79s.   ROS:   Please see the history of present illness.  Additional pertinent ROS:  Constitutional: Negative for chills, fever, night sweats, unintentional weight loss  HENT: Negative for ear pain and hearing loss.   Eyes: Negative for loss of vision and eye pain.  Respiratory: Negative for cough, sputum, wheezing.   Cardiovascular: See HPI. Gastrointestinal: Negative for abdominal pain, melena, and hematochezia.  Genitourinary: Negative for dysuria and hematuria.  Musculoskeletal: Negative for falls and myalgias.  Skin: Negative for itching and rash.  Neurological: Negative for focal weakness, focal sensory changes and loss of consciousness.  Endo/Heme/Allergies: Does not bruise/bleed easily.    EKGs/Labs/Other Studies Reviewed:    The following studies were reviewed today: Echo from [facility] 2006: FINDINGS LEFT VENTRICLE -  EF = 55 +/- 5%. The left ventricle appears normal in size. Left ventricular systolic function is normal. RIGHT VENTRICLE - The right ventricle is normal in size and systolic function. LEFT ATRIUM/PULMONARY VEINS - The left atrium is normal. The left atrial cavity appears normal in size. The left atrial cavity area is 19.0 cm during systole. RIGHT ATRIUM/IVC/SVC - The right atrium is normal. MITRAL VALVE - The mitral valve is normal. TRICUSPID VALVE -  There is 2+ tricuspid regurgitation. Regurgitant velocity is 270 cm/s and estimated RV systolic pressure is 34 mm Hg. AORTIC VALVE/LVOT - This is a tricuspid aortic valve. There is 1+ aortic regurgitation. PULMONIC VALVE - The pulmonic valve is  normal. AORTA - The aorta is normal. PULMONARY ARTERIES - The pulmonary arteries are normal. IAS/IVS - The interatrial and interventricular septa are normal. There is right to left flow through the interatrial septum PERICARDIUM - The pericardium is normal.  CONCLUSIONS 1. Normal LV size and function  2. Normal RV function 3. 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 Mild aortic regurgitation. 5. Positive saline microcavitation study but bubbles appear in the LV after 3  cardiac cycles, more suggestive of intrapulmonary shunt.  EKG:  EKG is personally reviewed.  The ekg ordered today demonstrates normal sinus rhythm, no delta wave.  Recent Labs: 12/14/2018: ALT 15; BUN 17; Creatinine, Ser 0.88; Hemoglobin 13.7; Platelets 236.0; Potassium 4.3; Sodium 140  Recent Lipid Panel  Component Value Date/Time   CHOL 215 (H) 12/14/2018 0821   TRIG 54.0 12/14/2018 0821   HDL 60.30 12/14/2018 0821   CHOLHDL 4 12/14/2018 0821   VLDL 10.8 12/14/2018 0821   LDLCALC 144 (H) 12/14/2018 0821    Physical Exam:    VS:  BP 118/84 (BP Location: Right Arm)   Pulse 80   Ht 5\' 10"  (1.778 m)   Wt 267 lb 6.4 oz (121.3 kg)   SpO2 97%   BMI 38.37 kg/m     Wt Readings from Last 3 Encounters:  01/25/19 267 lb 6.4 oz (121.3 kg)  12/14/18 262 lb (118.8 kg)  12/06/18 261 lb 8 oz (118.6 kg)     GEN: Well nourished, well developed in no acute distress HEENT: Normal NECK: JVD about 7-8 cm, no HJR. No carotid bruits LYMPHATICS: No lymphadenopathy CARDIAC: regular rhythm, normal S1 and S2, no rubs, gallops. 1/6 HSM at LSB. Radial and DP pulses 2+ bilaterally. RESPIRATORY:  Clear to auscultation without rales, wheezing or rhonchi  ABDOMEN: Soft, non-tender, non-distended MUSCULOSKELETAL:  No edema; No deformity  SKIN: Warm and dry NEUROLOGIC:  Alert and oriented x 3 PSYCHIATRIC:  Normal affect   ASSESSMENT:    1. PFO (patent foramen ovale)   2. Ebstein anomaly    3. Class 2 obesity due to excess calories without serious comorbidity with body mass index (BMI) of 38.0 to 38.9 in adult   4. Cardiac risk counseling   5. Nutritional counseling   6. Counseling on health promotion and disease prevention    PLAN:    Ebstein's anomaly: per available notes, mild variant. Has not limited her. -no clinical symptoms of heart failure -requesting records for most recent RV function. Prior echo in 2006 was normal RV function -not doing much exercise, but has not felt limited by symptoms. Monitor -no history of cyanosis or paradoxical embolism -reported flow across IAS in 2006, though bubble study suggest intrapulmonary shunt. Possible PFO. -no significant arrhythmia seen on prior monitor evaluation. No delta wave on ECG. Palpitations improved since cutting back on caffeine  Primary prevention: -recommend heart healthy/Mediterranean diet, with whole grains, fruits, vegetable, fish, lean meats, nuts, and olive oil. Limit salt. -recommend moderate walking, 3-5 times/week for 30-50 minutes each session. Aim for at least 150 minutes.week. Goal should be pace of 3 miles/hours, or walking 1.5 miles in 30 minutes -recommend avoidance of tobacco products. Avoid excess alcohol. -Additional risk factor control:  -Diabetes: A1c is 5.9, working on lifestyle modification, no formal diagnosis yet of diabetes  -Lipids: Tchol 215, HDL 60, LDL 144, TG 54. Wants to modify with lifestyle.  -Blood pressure control: systolic at goal, diastolic mildly elevated, working on lifestyle  -Weight: BMI 38. Working on weight loss. Admits poor diet and no intentional exercise. Working to modify this. -ASCVD risk score: The 10-year ASCVD risk score Denman George(Goff DC Montez HagemanJr., et al., 2013) is: 2.3%   Values used to calculate the score:     Age: [age] years     Sex: Female     Is Non-Hispanic African American: No     Diabetic: No     Tobacco smoker: No     Systolic Blood Pressure: 118 mmHg     Is BP  treated: No     HDL Cholesterol: 60.3 mg/dL     Total Cholesterol: 215 mg/dL   Plan for follow up: 1 year or sooner PRN  Medication Adjustments/Labs and Tests Ordered: Current medicines are reviewed at length with the  patient today.  Concerns regarding medicines are outlined above.  Orders Placed This Encounter  Procedures  . EKG 12-Lead   No orders of the defined types were placed in this encounter.   Patient Instructions  Medication Instructions:  Your Physician recommend you continue on your current medication as directed.    If you need a refill on your cardiac medications before your next appointment, please call your pharmacy.   Lab work: None  Testing/Procedures: None  Follow-Up: At [facility], you and your health needs are our priority.  As part of our continuing mission to provide you with exceptional heart care, we have created designated Provider Care Teams.  These Care Teams include your primary Cardiologist (physician) and Advanced Practice Providers (APPs -  Physician Assistants and Nurse Practitioners) who all work together to provide you with the care you need, when you need it. You will need a follow up appointment in 1 years.  Please call our office 2 months in advance to schedule this appointment.  You may see Dr. Cristal Deer or one of the following Advanced Practice Providers on your designated Care Team:   Theodore Demark, PA-C . Joni Reining, DNP, ANP      Signed, Jodelle Red, MD PhD 01/25/2019 3:33 PM    [facility]

## 2019-04-26 ENCOUNTER — Other Ambulatory Visit: Payer: Self-pay | Admitting: Physician Assistant

## 2019-04-26 DIAGNOSIS — Z1231 Encounter for screening mammogram for malignant neoplasm of breast: Secondary | ICD-10-CM

## 2019-05-28 ENCOUNTER — Other Ambulatory Visit: Payer: Self-pay

## 2019-05-28 ENCOUNTER — Ambulatory Visit
Admission: RE | Admit: 2019-05-28 | Discharge: 2019-05-28 | Disposition: A | Discharge status: Home / Self Care | Payer: 59 | Source: Ambulatory Visit | Attending: Physician Assistant | Admitting: Physician Assistant

## 2019-05-28 DIAGNOSIS — Z1231 Encounter for screening mammogram for malignant neoplasm of breast: Secondary | ICD-10-CM | POA: Diagnosis not present

## 2019-06-12 ENCOUNTER — Encounter: Payer: Self-pay | Admitting: Physician Assistant

## 2019-06-13 NOTE — Telephone Encounter (Signed)
I spoke with patient and she is on the schedule for a Doxy[facility]me appointment with Sam, this Friday.

## 2019-06-13 NOTE — Telephone Encounter (Signed)
Left message on personal voicemail to call office and schedule virtual visit with [facility] for medication refill. Also sent My Chart message.

## 2019-06-17 ENCOUNTER — Ambulatory Visit (INDEPENDENT_AMBULATORY_CARE_PROVIDER_SITE_OTHER): Payer: 59 | Admitting: Physician Assistant

## 2019-06-17 ENCOUNTER — Encounter: Payer: Self-pay | Admitting: Physician Assistant

## 2019-06-17 VITALS — BP 126/78 | HR 79 | Temp 97.5°F

## 2019-06-17 DIAGNOSIS — F419 Anxiety disorder, unspecified: Secondary | ICD-10-CM

## 2019-06-17 MED ORDER — CITALOPRAM HYDROBROMIDE 20 MG PO TABS: 20.0000 mg | ORAL_TABLET | Freq: Every day | ORAL | 3 refills | Status: DC

## 2019-06-17 NOTE — Progress Notes (Signed)
Virtual Visit via Video   I connected with Susan Bender on 06/17/19 at  8:40 AM EDT by a video enabled telemedicine application and verified that I am speaking with the correct person using two identifiers. Location patient: Home Location provider: [facility], Office Persons participating in the virtual visit: Susan Bender, Chrisette Man PA-C.   I discussed the limitations of evaluation and management by telemedicine and the availability of in person appointments. The patient expressed understanding and agreed to proceed.  I acted as a Neurosurgeonscribe for Energy East CorporationSamantha Ladajah Soltys, PA-C Alishah-ClarkDonna Orphanos, LPN  Subjective:   HPI:   Anxiety Pt following up today for anxiety, currently taking Celexa 20 mg daily, complaint with medication and working well. Denies SI/HI. She is happy with her medication and would like to continue this dosage.  GAD 7 : Generalized Anxiety Score 06/17/2019 12/06/2018  Nervous, Anxious, on Edge 0 0  Control/stop worrying 0 0  Worry too much - different things 1 1  Trouble relaxing 0 0  Restless 0 0  Easily annoyed or irritable 1 0  Afraid - awful might happen 0 0  Total GAD 7 Score 2 1  Anxiety Difficulty Not difficult at all Not difficult at all          ROS: See pertinent positives and negatives per HPI.  Patient Active Problem List   Diagnosis Date Noted  . Obesity (BMI 30-39.9) 12/14/2018  . Anxiety 12/14/2018  . PFO (patent foramen ovale)   . Endometriosis   . Ebstein anomaly     Social History   Tobacco Use  . Smoking status: Never Smoker  . Smokeless tobacco: Never Used  Substance Use Topics  . Alcohol use: Yes    Comment: occassional glass of wine    Current Outpatient Medications:  .  cimetidine (TAGAMET) 200 MG tablet, Take 200 mg by mouth daily as needed., Disp: , Rfl:  .  citalopram (CELEXA) 20 MG tablet, Take 1 tablet (20 mg total) by mouth daily., Disp: 90 tablet, Rfl: 3 .  Multiple Vitamins-Minerals (ONE-A-DAY WOMENS  VITACRAVES) CHEW, Chew 1 each by mouth daily., Disp: , Rfl:  .  naproxen sodium (ALEVE) 220 MG tablet, Take 220 mg by mouth as needed., Disp: , Rfl:   Allergies  Allergen Reactions  . Sulfa Antibiotics Hives and Rash    Objective:   VITALS: Per patient if applicable, see vitals. GENERAL: Alert, appears well and in no acute distress. HEENT: Atraumatic, conjunctiva clear, no obvious abnormalities on inspection of external nose and ears. NECK: Normal movements of the head and neck. CARDIOPULMONARY: No increased WOB. Speaking in clear sentences. I:E ratio WNL.  MS: Moves all visible extremities without noticeable abnormality. PSYCH: Pleasant and cooperative, well-groomed. Speech normal rate and rhythm. Affect is appropriate. Insight and judgement are appropriate. Attention is focused, linear, and appropriate.  NEURO: CN grossly intact. Oriented as arrived to appointment on time with no prompting. Moves both UE equally.  SKIN: No obvious lesions, wounds, erythema, or cyanosis noted on face or hands.  Assessment and Plan:   Susan Bender was seen today for anxiety.  Diagnoses and all orders for this visit:  Anxiety  Other orders -     citalopram (CELEXA) 20 MG tablet; Take 1 tablet (20 mg total) by mouth daily.   Well controlled. Continue current regimen. Follow-up in 1 year, sooner if issues.  . Reviewed expectations re: course of current medical issues. . Discussed self-management of symptoms. . Outlined signs and symptoms indicating  need for more acute intervention. . Patient verbalized understanding and all questions were answered. Marland Kitchen Health Maintenance issues including appropriate healthy diet, exercise, and smoking avoidance were discussed with patient. . See orders for this visit as documented in the electronic medical record.  I discussed the assessment and treatment plan with the patient. The patient was provided an opportunity to ask questions and all were answered. The patient  agreed with the plan and demonstrated an understanding of the instructions.   The patient was advised to call back or seek an in-person evaluation if the symptoms worsen or if the condition fails to improve as anticipated.   CMA or LPN served as scribe during this visit. History, Physical, and Plan performed by medical provider. The above documentation has been reviewed and is accurate and complete.   [place], [place] 06/17/2019

## 2019-08-25 ENCOUNTER — Ambulatory Visit (INDEPENDENT_AMBULATORY_CARE_PROVIDER_SITE_OTHER): Payer: 59

## 2019-08-25 ENCOUNTER — Other Ambulatory Visit: Payer: Self-pay

## 2019-08-25 DIAGNOSIS — Z23 Encounter for immunization: Secondary | ICD-10-CM

## 2019-08-25 NOTE — Patient Instructions (Signed)
Health Maintenance Due  Topic Date Due  . INFLUENZA VACCINE  07/02/2019    Depression screen PHQ 2/9 12/06/2018  Decreased Interest 0  Down, Depressed, Hopeless 0  PHQ - 2 Score 0

## 2019-09-13 ENCOUNTER — Encounter: Payer: Self-pay | Admitting: Physician Assistant

## 2020-01-19 ENCOUNTER — Ambulatory Visit: Payer: 59 | Admitting: Cardiology

## 2020-01-23 ENCOUNTER — Ambulatory Visit (INDEPENDENT_AMBULATORY_CARE_PROVIDER_SITE_OTHER): Payer: 59 | Admitting: Cardiology

## 2020-01-23 ENCOUNTER — Encounter: Payer: Self-pay | Admitting: Cardiology

## 2020-01-23 ENCOUNTER — Other Ambulatory Visit: Payer: Self-pay

## 2020-01-23 VITALS — BP 122/70 | HR 81 | Ht 70.0 in | Wt 265.0 lb

## 2020-01-23 DIAGNOSIS — Q2112 Patent foramen ovale: Secondary | ICD-10-CM

## 2020-01-23 DIAGNOSIS — Z6838 Body mass index (BMI) 38.0-38.9, adult: Secondary | ICD-10-CM | POA: Diagnosis not present

## 2020-01-23 DIAGNOSIS — H524 Presbyopia: Secondary | ICD-10-CM | POA: Diagnosis not present

## 2020-01-23 DIAGNOSIS — Q225 Ebstein's anomaly: Secondary | ICD-10-CM

## 2020-01-23 DIAGNOSIS — Q211 Atrial septal defect: Secondary | ICD-10-CM | POA: Diagnosis not present

## 2020-01-23 DIAGNOSIS — H5213 Myopia, bilateral: Secondary | ICD-10-CM | POA: Diagnosis not present

## 2020-01-23 DIAGNOSIS — H18413 Arcus senilis, bilateral: Secondary | ICD-10-CM | POA: Diagnosis not present

## 2020-01-23 DIAGNOSIS — Z7189 Other specified counseling: Secondary | ICD-10-CM | POA: Diagnosis not present

## 2020-01-23 DIAGNOSIS — H52223 Regular astigmatism, bilateral: Secondary | ICD-10-CM | POA: Diagnosis not present

## 2020-01-23 DIAGNOSIS — E6609 Other obesity due to excess calories: Secondary | ICD-10-CM | POA: Diagnosis not present

## 2020-01-23 NOTE — Patient Instructions (Signed)
Medication Instructions:  Your Physician recommend you continue on your current medication as directed.    *If you need a refill on your cardiac medications before your next appointment, please call your pharmacy*  Lab Work: None  Testing/Procedures: Your physician has requested that you have an echocardiogram in 1 year. Echocardiography is a painless test that uses sound waves to create images of your heart. It provides your doctor with information about the size and shape of your heart and how well your heart's chambers and valves are working. This procedure takes approximately one hour. There are no restrictions for this procedure. [address]   Follow-Up: At [facility], you and your health needs are our priority.  As part of our continuing mission to provide you with exceptional heart care, we have created designated Provider Care Teams.  These Care Teams include your primary Cardiologist (physician) and Advanced Practice Providers (APPs -  Physician Assistants and Nurse Practitioners) who all work together to provide you with the care you need, when you need it.  Your next appointment:   1 year(s)  The format for your next appointment:   In Person  Provider:   Jodelle Red, MD

## 2020-01-23 NOTE — Progress Notes (Signed)
Cardiology Office Note:    Date:  01/23/2020   ID:  Vasti, Yagi [birth year], MRN [id number]  PCP:  Inda Coke, PA  Cardiologist:  Buford Dresser, MD PhD  Referring MD: Inda Coke, [place]   CC: follow up  History of Present Illness:    Susan Bender is a 59 y.o. female with a hx of Ebstein's anomaly who is seen for follow up. I initially saw her 01/25/19 as a new consult at the request of Inda Coke, [place] for the evaluation and management of Ebstein's.  Reviewed [facility] records from [place]. Reports that she had an echo at her last visit with Dr. Tawnya Crook in [place], [place] in 2017.   Cardiac history: Diagnosed in her 32s, no prior cardiac history. 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 Checked on an ECG, saw fast rhythm, went to see provider. Had holter monitor placed and was placed on medication by an NP. (per Dr. Dillon Bjork notes, Holter showed isolated PACs and PVCs, was placed on beta blocker). Felt terrible on medication, referred to Dr. Tawnya Crook in [place], [place]. Had normal stress test, then had echo that showed Ebstein's. Was referred to [facility]. Corine Shelter at the [facility], has one visit there. Was followed annually by Dr. Tawnya Crook with echo every 3-5 years.   Today: Has had two doses of the Covid vaccine (works in [place] at [employer]). Has mild shortness of breath, but can climb at least two flights of stairs and does so on a regular basis.   Denies chest pain, shortness of breath at rest or with normal exertion. No PND, orthopnea, LE edema or unexpected weight gain. No syncope. Rare palpitations. Drinks 1/2 caff coffee, notices more when she drinks full caffeinated coffee.  We reviewed Ebstein's, associated abnormalities, signs/symptoms to watch for.  Denies chest pain, shortness of breath at rest. No PND, orthopnea, LE edema or unexpected weight gain. No syncope.  Past  Medical History:  Diagnosis Date  . Anxiety   . 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 Endometriosis   . PFO (patent foramen ovale)     Past Surgical History:  Procedure Laterality Date  . BREAST BIOPSY Right   . BREAST EXCISIONAL BIOPSY Left    x2  . LASER LAPAROSCOPY    . WRIST SURGERY Right 2019    Current Medications: Current Outpatient Medications on File Prior to Visit  Medication Sig  . cimetidine (TAGAMET) 200 MG tablet Take 200 mg by mouth daily as needed.  . citalopram (CELEXA) 20 MG tablet Take 1 tablet (20 mg total) by mouth daily.  . Multiple Vitamins-Minerals (ONE-A-DAY WOMENS VITACRAVES) CHEW Chew 1 each by mouth daily.  . naproxen sodium (ALEVE) 220 MG tablet Take 220 mg by mouth as needed.   No current facility-administered medications on file prior to visit.     Allergies:   Sulfa antibiotics   Social History   Tobacco Use  . Smoking status: Never Smoker  . Smokeless tobacco: Never Used  Substance Use Topics  . Alcohol use: Yes    Comment: occassional glass of wine  . Drug use: Never    Family History: The patient's family history includes Alzheimer's disease in her mother; Arthritis in her mother; Asthma in her sister; Breast cancer in her paternal aunt; COPD in her sister; Diabetes in her father and sister; Heart attack in her father  and paternal grandfather; Heart disease in her father and paternal grandfather; Hypertension in her father and sister; Parkinson's disease in her father. Dad had MI and CABG in his late 35s.   ROS:   Please see the history of present illness.  Additional pertinent ROS negative except as documented.    EKGs/Labs/Other Studies Reviewed:    The following studies were reviewed today: Echo from [facility] 2006: FINDINGS LEFT VENTRICLE -  EF = 55 +/- 5%. The left ventricle appears normal in size. Left ventricular systolic function is normal. RIGHT VENTRICLE - The right ventricle is normal in size and systolic  function. LEFT ATRIUM/PULMONARY VEINS - The left atrium is normal. The left atrial cavity appears normal in size. The left atrial cavity area is 19.0 cm during systole. RIGHT ATRIUM/IVC/SVC - The right atrium is normal. MITRAL VALVE - The mitral valve is normal. TRICUSPID VALVE -  There is 2+ tricuspid regurgitation. Regurgitant velocity is 270 cm/s and estimated RV systolic pressure is 34 mm Hg. AORTIC VALVE/LVOT - This is a tricuspid aortic valve. There is 1+ aortic regurgitation. PULMONIC VALVE - The pulmonic valve is normal. AORTA - The aorta is normal. PULMONARY ARTERIES - The pulmonary arteries are normal. IAS/IVS - The interatrial and interventricular septa are normal. There is right to left flow through the interatrial septum PERICARDIUM - The pericardium is normal.  CONCLUSIONS 1. Normal LV size and function  2. Normal RV function 3. 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 Mild aortic regurgitation. 5. Positive saline microcavitation study but bubbles appear in the LV after 3  cardiac cycles, more suggestive of intrapulmonary shunt.  EKG:  EKG is personally reviewed.  The ekg ordered today demonstrates normal sinus rhythm, no delta wave. Low anterior forces V1-V3.  Recent Labs: No results found for requested labs within last 8760 hours.  Recent Lipid Panel    Component Value Date/Time   CHOL 215 (H) 12/14/2018 0821   TRIG 54.0 12/14/2018 0821   HDL 60.30 12/14/2018 0821   CHOLHDL 4 12/14/2018 0821   VLDL 10.8 12/14/2018 0821   LDLCALC 144 (H) 12/14/2018 0821    Physical Exam:    VS:  BP 122/70   Pulse 81   Ht 5\' 10"  (1.778 m)   Wt 265 lb (120.2 kg)   SpO2 100%   BMI 38.02 kg/m     Wt Readings from Last 3 Encounters:  01/23/20 265 lb (120.2 kg)  01/25/19 267 lb 6.4 oz (121.3 kg)  12/14/18 262 lb (118.8 kg)    GEN: Well nourished, well developed in no acute distress HEENT: Normal, moist mucous membranes NECK: No  JVD CARDIAC: regular rhythm, normal S1 and S2, no rubs or gallops. 1/6 HSM at LSB. VASCULAR: Radial and DP pulses 2+ bilaterally. No carotid bruits RESPIRATORY:  Clear to auscultation without rales, wheezing or rhonchi  ABDOMEN: Soft, non-tender, non-distended MUSCULOSKELETAL:  Ambulates independently SKIN: Warm and dry, no edema NEUROLOGIC:  Alert and oriented x 3. No focal neuro deficits noted. PSYCHIATRIC:  Normal affect   ASSESSMENT:    1. Ebstein anomaly   2. PFO (patent foramen ovale)   3. Class 2 obesity due to excess calories without serious comorbidity with body mass index (BMI) of 38.0 to 38.9 in adult   4. Cardiac risk counseling   5. Counseling on health promotion and disease prevention    PLAN:    Ebstein's anomaly: per available notes, mild variant. Has not limited her. -  no clinical symptoms of heart failure -Prior echo in 2006 was normal RV function. Reports last echo ~2017, I do not have a copy of this -no history of cyanosis or paradoxical embolism -reported flow across IAS in 2006, though bubble study suggest intrapulmonary shunt. Possible PFO. -no significant arrhythmia seen on prior monitor evaluation. No delta wave on ECG.  -discussed Ebstein's, signs of right heart failure to watch for. No evidence of this today -will be due for 5 year echo next year, order, to be done prior to our visit so that we can review  Primary prevention: -recommend heart healthy/Mediterranean diet, with whole grains, fruits, vegetable, fish, lean meats, nuts, and olive oil. Limit salt. -recommend moderate walking, 3-5 times/week for 30-50 minutes each session. Aim for at least 150 minutes.week. Goal should be pace of 3 miles/hours, or walking 1.5 miles in 30 minutes -recommend avoidance of tobacco products. Avoid excess alcohol. -Additional risk factor control:  -Diabetes: A1c is 5.9, working on lifestyle modification, no formal diagnosis yet of diabetes  -Lipids: Tchol 215, HDL 60,  LDL 144, TG 54.   -Blood pressure control: at goal, on no meds  -Weight: BMI 38, class II obesity. Lost weight, then gained back with Covid. Working on returning to good habits. -ASCVD risk score: The 10-year ASCVD risk score Denman George DC Montez Hageman., et al., 2013) is: 2.7%   Values used to calculate the score:     Age: 3 years     Sex: Female     Is Non-Hispanic African American: No     Diabetic: No     Tobacco smoker: No     Systolic Blood Pressure: 122 mmHg     Is BP treated: No     HDL Cholesterol: 60.3 mg/dL     Total Cholesterol: 215 mg/dL   Plan for follow up: 1 year or sooner PRN, with echo to be done prior  Medication Adjustments/Labs and Tests Ordered: Current medicines are reviewed at length with the patient today.  Concerns regarding medicines are outlined above.  Orders Placed This Encounter  Procedures  . EKG 12-Lead  . ECHOCARDIOGRAM COMPLETE   No orders of the defined types were placed in this encounter.   Patient Instructions  Medication Instructions:  Your Physician recommend you continue on your current medication as directed.    *If you need a refill on your cardiac medications before your next appointment, please call your pharmacy*  Lab Work: None  Testing/Procedures: Your physician has requested that you have an echocardiogram in 1 year. Echocardiography is a painless test that uses sound waves to create images of your heart. It provides your doctor with information about the size and shape of your heart and how well your heart's chambers and valves are working. This procedure takes approximately one hour. There are no restrictions for this procedure. [address]   Follow-Up: At [facility], you and your health needs are our priority.  As part of our continuing mission to provide you with exceptional heart care, we have created designated Provider Care Teams.  These Care Teams include your primary Cardiologist (physician) and Advanced  Practice Providers (APPs -  Physician Assistants and Nurse Practitioners) who all work together to provide you with the care you need, when you need it.  Your next appointment:   1 year(s)  The format for your next appointment:   In Person  Provider:   Jodelle Red, MD       Signed, Jodelle Red,  MD PhD 01/23/2020 12:39 PM    [facility]

## 2020-03-18 ENCOUNTER — Emergency Department
Admission: EM | Admit: 2020-03-18 | Discharge: 2020-03-18 | Disposition: A | Discharge status: Home / Self Care | Payer: 59 | Source: Home / Self Care

## 2020-03-18 ENCOUNTER — Other Ambulatory Visit: Payer: Self-pay

## 2020-03-18 ENCOUNTER — Encounter: Payer: Self-pay | Admitting: Emergency Medicine

## 2020-03-18 DIAGNOSIS — S76912A Strain of unspecified muscles, fascia and tendons at thigh level, left thigh, initial encounter: Secondary | ICD-10-CM

## 2020-03-18 MED ORDER — CYCLOBENZAPRINE HCL 5 MG PO TABS: 5.0000 mg | ORAL_TABLET | Freq: Two times a day (BID) | ORAL | 0 refills | Status: DC | PRN

## 2020-03-18 NOTE — ED Provider Notes (Signed)
[facility]    CSN: [id number] Arrival date & time: 03/18/20  1302      History   Chief Complaint Chief Complaint  Patient presents with  . Leg Problem    left upper    HPI Susan Bender is a 59 y.o. Bender.   HPI Susan Bender is a 59 y.o. Bender presenting to UC with c/o Left groin and anterior thigh pain that started initially a few weeks ago while doing house work. Then, 3 days ago, she was on her hands and knees cleaning for a few hours, re-injuring the same area. Pain is 6/10, aching and sore, worse with movement including ambulating and prolonged standing.  She has tired ice and ibuprofen with mild relief. She attempted to apply an ace wrap but it kept falling whenever she walked.  Denies back pain or knee pain. No numbness. No change in bowel or bladder habits.  No calf pain.   Past Medical History:  Diagnosis Date  . Anxiety   . Ebstein anomaly    diagnosed in age 89's  . Endometriosis   . PFO (patent foramen ovale)     Patient Active Problem List   Diagnosis Date Noted  . Obesity (BMI 30-39.9) 12/14/2018  . Anxiety 12/14/2018  . PFO (patent foramen ovale)   . Endometriosis   . Ebstein anomaly     Past Surgical History:  Procedure Laterality Date  . BREAST BIOPSY Right   . BREAST EXCISIONAL BIOPSY Left    x2  . LASER LAPAROSCOPY    . WRIST SURGERY Right 2019    OB History   No obstetric history on file.      Home Medications    Prior to Admission medications   Medication Sig Start Date End Date Taking? Authorizing Provider  cimetidine (TAGAMET) 200 MG tablet Take 200 mg by mouth daily as needed.    [provider]  citalopram (CELEXA) 20 MG tablet Take 1 tablet (20 mg total) by mouth daily. 06/17/19   Jarold Motto, PA  cyclobenzaprine (FLEXERIL) 5 MG tablet Take 1-2 tablets (5-10 mg total) by mouth 2 (two) times daily as needed for muscle spasms. 03/18/20   Lurene Shadow, PA-C  Multiple Vitamins-Minerals  (ONE-A-DAY WOMENS VITACRAVES) CHEW Chew 1 each by mouth daily.    [provider]  naproxen sodium (ALEVE) 220 MG tablet Take 220 mg by mouth as needed.    [provider]    Family History Family History  Problem Relation Age of Onset  . Arthritis Mother   . Alzheimer's disease Mother   . Diabetes Father   . Heart attack Father   . Heart disease Father   . Hypertension Father   . Parkinson's disease Father   . COPD Sister   . Diabetes Sister   . Hypertension Sister   . Heart attack Paternal Grandfather   . Heart disease Paternal Grandfather   . Asthma Sister   . Breast cancer Paternal Aunt     Social History Social History   Tobacco Use  . Smoking status: Never Smoker  . Smokeless tobacco: Never Used  Substance Use Topics  . Alcohol use: Yes    Comment: occassional glass of wine  . Drug use: Never     Allergies   Sulfa antibiotics   Review of Systems Review of Systems  Musculoskeletal: Positive for gait problem and myalgias. Negative for arthralgias and joint swelling.  Skin: Negative for color change and wound.  Neurological:  Negative for weakness and numbness.     Physical Exam Triage Vital Signs ED Triage Vitals  Enc Vitals Group     BP 03/18/20 1322 (!) 159/89     Pulse Rate 03/18/20 1322 87     Resp 03/18/20 1322 16     Temp 03/18/20 1322 97.6 F (36.4 C)     Temp Source 03/18/20 1322 Oral     SpO2 03/18/20 1322 98 %     Weight 03/18/20 1323 265 lb (120.2 kg)     Height 03/18/20 1323 5\' 10"  (1.778 m)     Head Circumference --      Peak Flow --      Pain Score 03/18/20 1323 6     Pain Loc --      Pain Edu? --      Excl. in GC? --    No data found.  Updated Vital Signs BP (!) 159/89 (BP Location: Right Arm)   Pulse 87   Temp 97.6 F (36.4 C) (Oral)   Resp 16   Ht 5\' 10"  (1.778 m)   Wt 265 lb (120.2 kg)   SpO2 98%   BMI 38.02 kg/m   Visual Acuity Right Eye Distance:   Left Eye Distance:   Bilateral Distance:     Right Eye Near:   Left Eye Near:    Bilateral Near:     Physical Exam Vitals and nursing note reviewed.  Constitutional:      Appearance: Normal appearance. She is well-developed.  HENT:     Head: Normocephalic and atraumatic.  Cardiovascular:     Rate and Rhythm: Normal rate.  Pulmonary:     Effort: Pulmonary effort is normal.  Musculoskeletal:        General: Tenderness present. No swelling. Normal range of motion.     Cervical back: Normal range of motion.       Legs:  Skin:    General: Skin is warm and dry.  Neurological:     Mental Status: She is alert and oriented to person, place, and time.  Psychiatric:        Behavior: Behavior normal.      UC Treatments / Results  Labs (all labs ordered are listed, but only abnormal results are displayed) Labs Reviewed - No data to display  EKG   Radiology No results found.  Procedures Procedures (including critical care time)  Medications Ordered in UC Medications - No data to display  Initial Impression / Assessment and Plan / UC Course  I have reviewed the triage vital signs and the nursing notes.  Pertinent labs & imaging results that were available during my care of the patient were reviewed by me and considered in my medical decision making (see chart for details).     Hx and exam c/w muscle strain Doubt DVT No indication for imaging at this time. Encouraged symptomatic tx  AVS provided  Final Clinical Impressions(s) / UC Diagnoses   Final diagnoses:  Muscle strain of left thigh, initial encounter     Discharge Instructions      Flexeril (cyclobenzaprine) is a muscle relaxer and may cause drowsiness. Do not drink alcohol, drive, or operate heavy machinery while taking.  You may take 500mg  acetaminophen every 4-6 hours or in combination with ibuprofen 400-600mg  every 6-8 hours as needed for pain and inflammation.  Please follow up with family medicine or sports medicine later this week if not  improving.    ED Prescriptions  Medication Sig Dispense Auth. Provider   cyclobenzaprine (FLEXERIL) 5 MG tablet Take 1-2 tablets (5-10 mg total) by mouth 2 (two) times daily as needed for muscle spasms. 30 tablet Noe Gens, [place]     PDMP not reviewed this encounter.   Noe Gens, [place] 03/21/20 [phone]

## 2020-03-18 NOTE — Discharge Instructions (Signed)
  Flexeril (cyclobenzaprine) is a muscle relaxer and may cause drowsiness. Do not drink alcohol, drive, or operate heavy machinery while taking.  You may take 500mg  acetaminophen every 4-6 hours or in combination with ibuprofen 400-600mg  every 6-8 hours as needed for pain and inflammation.  Please follow up with family medicine or sports medicine later this week if not improving.

## 2020-03-18 NOTE — ED Triage Notes (Signed)
Patient here for a re-injury of upper left leg 3 days ago; original pain a few weeks ago; hurts in groin and radiates inner and lateral thigh; limiting her ability to move well although refused wheelchair for triage. Last ibuprofen last evening. No known exposure to covid positive person.

## 2020-07-03 ENCOUNTER — Telehealth: Payer: Self-pay | Admitting: Physician Assistant

## 2020-07-03 ENCOUNTER — Other Ambulatory Visit: Payer: Self-pay | Admitting: Physician Assistant

## 2020-07-03 DIAGNOSIS — Z1231 Encounter for screening mammogram for malignant neoplasm of breast: Secondary | ICD-10-CM

## 2020-07-03 NOTE — Telephone Encounter (Signed)
  LAST APPOINTMENT DATE: 06/17/2019  NEXT APPOINTMENT DATE:@8 /31/2021  MEDICATION: citalopram (CELEXA) 20 MG tablet  PHARMACY:[id number] - [place], [place] - [address]

## 2020-07-04 MED ORDER — CITALOPRAM HYDROBROMIDE 20 MG PO TABS: 20.0000 mg | ORAL_TABLET | Freq: Every day | ORAL | 1 refills | Status: DC

## 2020-07-04 NOTE — Telephone Encounter (Signed)
Left detailed message on personal voicemail Rx was sent to pharmacy as requested Celexa. Any questions please call the office.

## 2020-07-11 ENCOUNTER — Other Ambulatory Visit: Payer: Self-pay

## 2020-07-11 ENCOUNTER — Ambulatory Visit
Admission: RE | Admit: 2020-07-11 | Discharge: 2020-07-11 | Disposition: A | Discharge status: Home / Self Care | Payer: Managed Care, Other (non HMO) | Source: Ambulatory Visit | Attending: Physician Assistant | Admitting: Physician Assistant

## 2020-07-11 DIAGNOSIS — Z1231 Encounter for screening mammogram for malignant neoplasm of breast: Secondary | ICD-10-CM

## 2020-07-31 ENCOUNTER — Other Ambulatory Visit: Payer: Self-pay

## 2020-07-31 ENCOUNTER — Ambulatory Visit (INDEPENDENT_AMBULATORY_CARE_PROVIDER_SITE_OTHER): Payer: Managed Care, Other (non HMO) | Admitting: Physician Assistant

## 2020-07-31 ENCOUNTER — Encounter: Payer: Self-pay | Admitting: Physician Assistant

## 2020-07-31 VITALS — BP 124/88 | HR 73 | Temp 97.2°F | Ht 68.5 in | Wt 275.5 lb

## 2020-07-31 DIAGNOSIS — Q211 Atrial septal defect: Secondary | ICD-10-CM

## 2020-07-31 DIAGNOSIS — Z13220 Encounter for screening for lipoid disorders: Secondary | ICD-10-CM | POA: Diagnosis not present

## 2020-07-31 DIAGNOSIS — Z0000 Encounter for general adult medical examination without abnormal findings: Secondary | ICD-10-CM | POA: Diagnosis not present

## 2020-07-31 DIAGNOSIS — E669 Obesity, unspecified: Secondary | ICD-10-CM

## 2020-07-31 DIAGNOSIS — Q225 Ebstein's anomaly: Secondary | ICD-10-CM

## 2020-07-31 DIAGNOSIS — F419 Anxiety disorder, unspecified: Secondary | ICD-10-CM

## 2020-07-31 DIAGNOSIS — Z136 Encounter for screening for cardiovascular disorders: Secondary | ICD-10-CM

## 2020-07-31 DIAGNOSIS — Q2112 Patent foramen ovale: Secondary | ICD-10-CM

## 2020-07-31 NOTE — Progress Notes (Signed)
I acted as a Neurosurgeon for [employer], PA-C Susan Mull, LPN    Subjective:    Susan Bender is a 59 y.o. female and is here for a comprehensive physical exam.   HPI  There are no preventive care reminders to display for this patient.  Acute Concerns: None  Chronic Issues: Ebstein abnormality/PFO -- followed by Dr. Cristal Bender; doing well. Has echo scheduled. Anxiety -- feels like she is doing well with her celexa 20 mg daily. Denies SI/HI.  Health Maintenance: Immunizations -- UTD, will get Flu shot at work. Colonoscopy -- N/A Mammogram -- UTD, due 07/2021 PAP -- UTD, due 03/2021 Bone Density -- N/A Diet -- overall variable diet; tries to avoid sweets; avoids sodas/juices Sleep habits -- no major concerns Exercise -- minimal Weight -- Weight: 275 lb 8 oz (125 kg)  Mood -- overall doing well Weight history: Wt Readings from Last 10 Encounters:  07/31/20 275 lb 8 oz (125 kg)  03/18/20 265 lb (120.2 kg)  01/23/20 265 lb (120.2 kg)  01/25/19 267 lb 6.4 oz (121.3 kg)  12/14/18 262 lb (118.8 kg)  12/06/18 261 lb 8 oz (118.6 kg)   Body mass index is 41.28 kg/m. No LMP recorded. Patient is postmenopausal. Alcohol use: limited Tobacco use: none  Depression screen PHQ 2/9 07/31/2020  Decreased Interest 0  Down, Depressed, Hopeless 0  PHQ - 2 Score 0     Other providers/specialists: Patient Care Team: Susan Bender, [place] as PCP - General (Physician Assistant)    PMHx, SurgHx, SocialHx, Medications, and Allergies were reviewed in the Visit Navigator and updated as appropriate.   Past Medical History:  Diagnosis Date  . Anxiety   . 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 Endometriosis   . PFO (patent foramen ovale)      Past Surgical History:  Procedure Laterality Date  . BREAST BIOPSY Right   . BREAST EXCISIONAL BIOPSY Left    x2  . LASER LAPAROSCOPY    . WRIST SURGERY Right 2019     Family History  Problem Relation Age of Onset   . Arthritis Mother   . Alzheimer's disease Mother   . Diabetes Father   . Heart attack Father   . Heart disease Father   . Hypertension Father   . Parkinson's disease Father   . COPD Sister   . Diabetes Sister   . Hypertension Sister   . Heart attack Paternal Grandfather   . Heart disease Paternal Grandfather   . Asthma Sister   . Breast cancer Paternal Aunt     Social History   Tobacco Use  . Smoking status: Never Smoker  . Smokeless tobacco: Never Used  Vaping Use  . Vaping Use: Never used  Substance Use Topics  . Alcohol use: Yes    Comment: occassional glass of wine  . Drug use: Never    Review of Systems:   Review of Systems  Constitutional: Negative for chills, fever, malaise/fatigue and weight loss.  HENT: Negative for hearing loss, sinus pain and sore throat.   Eyes: Negative for blurred vision.  Respiratory: Negative for cough and shortness of breath.   Cardiovascular: Negative for chest pain, palpitations and leg swelling.  Gastrointestinal: Negative for abdominal pain, constipation, diarrhea, heartburn, nausea and vomiting.  Genitourinary: Negative for dysuria, frequency and urgency.  Musculoskeletal: Negative for back pain, myalgias and neck pain.  Skin: Negative for itching and rash.  Neurological: Negative for dizziness, tingling, seizures, loss  of consciousness and headaches.  Endo/Heme/Allergies: Negative for polydipsia.  Psychiatric/Behavioral: Negative for depression. The patient is not nervous/anxious.   All other systems reviewed and are negative.   Objective:   BP 124/88 (BP Location: Left Arm, Patient Position: Sitting, Cuff Size: Large)   Pulse 73   Temp (!) 97.2 F (36.2 C) (Temporal)   Ht 5' 8.5" (1.74 m)   Wt 275 lb 8 oz (125 kg)   SpO2 98%   BMI 41.28 kg/m  Body mass index is 41.28 kg/m.   General Appearance:    Alert, cooperative, no distress, appears stated age  Head:    Normocephalic, without obvious abnormality,  atraumatic  Eyes:    PERRL, conjunctiva/corneas clear, EOM's intact, fundi    benign, both eyes  Ears:    Normal TM's and external ear canals, both ears  Nose:   Nares normal, septum midline, mucosa normal, no drainage    or sinus tenderness  Throat:   Lips, mucosa, and tongue normal; teeth and gums normal  Neck:   Supple, symmetrical, trachea midline, no adenopathy;    thyroid:  no enlargement/tenderness/nodules; no carotid   bruit or JVD  Back:     Symmetric, no curvature, ROM normal, no CVA tenderness  Lungs:     Clear to auscultation bilaterally, respirations unlabored  Chest Wall:    No tenderness or deformity   Heart:    Regular rate and rhythm, S1 and S2 normal, no murmur, rub or gallop  Breast Exam:    No tenderness, masses, or nipple abnormality  Abdomen:     Soft, non-tender, bowel sounds active all four quadrants,    no masses, no organomegaly  Genitalia:    Deferred  Extremities:   Extremities normal, atraumatic, no cyanosis or edema  Pulses:   2+ and symmetric all extremities  Skin:   Skin color, texture, turgor normal, no rashes or lesions  Lymph nodes:   Cervical, supraclavicular, and axillary nodes normal  Neurologic:   CNII-XII intact, normal strength, sensation and reflexes    throughout     Assessment/Plan:   Susan Bender was seen today for annual exam.  Diagnoses and all orders for this visit:  Routine physical examination Today patient counseled on age appropriate routine health concerns for screening and prevention, each reviewed and up to date or declined. Immunizations reviewed and up to date or declined. Labs ordered and reviewed. Risk factors for depression reviewed and negative. Hearing function and visual acuity are intact. ADLs screened and addressed as needed. Functional ability and level of safety reviewed and appropriate. Education, counseling and referrals performed based on assessed risks today. Patient provided with a copy of personalized plan for  preventive services.  Obesity (BMI 30-39.9) Encouraged healthy lifestyles. She declined medications. -     CBC with Differential/Platelet; Future -     Comprehensive metabolic panel; Future -     CBC with Differential/Platelet -     Comprehensive metabolic panel  Encounter for lipid screening for cardiovascular disease -     Lipid panel; Future -     Lipid panel  PFO (patent foramen ovale); Ebstein anomaly Management per cardiology.  Anxiety Well controlled with Celexa 20 mg.  Follow-up in 1 year, sooner if concerns.    Well Adult Exam: Labs ordered: Yes. Patient counseling was done. See below for items discussed. Discussed the patient's BMI.  The BMI is not in the acceptable range; BMI management plan is completed Follow up in one year. Breast cancer screening:  UTD. Cervical cancer screening: UTD   Patient Counseling: [x]    Nutrition: Stressed importance of moderation in sodium/caffeine intake, saturated fat and cholesterol, caloric balance, sufficient intake of fresh fruits, vegetables, fiber, calcium, iron, and 1 mg of folate supplement per day (for females capable of pregnancy).  [x]    Stressed the importance of regular exercise.   [x]    Substance Abuse: Discussed cessation/primary prevention of tobacco, alcohol, or other drug use; driving or other dangerous activities under the influence; availability of treatment for abuse.   [x]    Injury prevention: Discussed safety belts, safety helmets, smoke detector, smoking near bedding or upholstery.   [x]    Sexuality: Discussed sexually transmitted diseases, partner selection, use of condoms, avoidance of unintended pregnancy  and contraceptive alternatives.  [x]    Dental health: Discussed importance of regular tooth brushing, flossing, and dental visits.  [x]    Health maintenance and immunizations reviewed. Please refer to Health maintenance section.   CMA or LPN served as scribe during this visit. History, Physical, and Plan performed  by medical provider. The above documentation has been reviewed and is accurate and complete.  , PA-C  [facility]

## 2020-07-31 NOTE — Patient Instructions (Signed)
It was great to see you!  Please go to the lab for blood work.   Our office will call you with your results unless you have chosen to receive results via MyChart.  If your blood work is normal we will follow-up each year for physicals and as scheduled for chronic medical problems.  If anything is abnormal we will treat accordingly and get you in for a follow-up.  Take care,  Susan Bender    Health Maintenance, Female Adopting a healthy lifestyle and getting preventive care are important in promoting health and wellness. Ask your health care provider about:  The right schedule for you to have regular tests and exams.  Things you can do on your own to prevent diseases and keep yourself healthy. What should I know about diet, weight, and exercise? Eat a healthy diet   Eat a diet that includes plenty of vegetables, fruits, low-fat dairy products, and lean protein.  Do not eat a lot of foods that are high in solid fats, added sugars, or sodium. Maintain a healthy weight Body mass index (BMI) is used to identify weight problems. It estimates body fat based on height and weight. Your health care provider can help determine your BMI and help you achieve or maintain a healthy weight. Get regular exercise Get regular exercise. This is one of the most important things you can do for your health. Most adults should:  Exercise for at least 150 minutes each week. The exercise should increase your heart rate and make you sweat (moderate-intensity exercise).  Do strengthening exercises at least twice a week. This is in addition to the moderate-intensity exercise.  Spend less time sitting. Even light physical activity can be beneficial. Watch cholesterol and blood lipids Have your blood tested for lipids and cholesterol at 60 years of age, then have this test every 5 years. Have your cholesterol levels checked more often if:  Your lipid or cholesterol levels are high.  You are older than 60  years of age.  You are at high risk for heart disease. What should I know about cancer screening? Depending on your health history and family history, you may need to have cancer screening at various ages. This may include screening for:  Breast cancer.  Cervical cancer.  Colorectal cancer.  Skin cancer.  Lung cancer. What should I know about heart disease, diabetes, and high blood pressure? Blood pressure and heart disease  High blood pressure causes heart disease and increases the risk of stroke. This is more likely to develop in people who have high blood pressure readings, are of African descent, or are overweight.  Have your blood pressure checked: ? Every 3-5 years if you are 18-39 years of age. ? Every year if you are 40 years old or older. Diabetes Have regular diabetes screenings. This checks your fasting blood sugar level. Have the screening done:  Once every three years after age 40 if you are at a normal weight and have a low risk for diabetes.  More often and at a younger age if you are overweight or have a high risk for diabetes. What should I know about preventing infection? Hepatitis B If you have a higher risk for hepatitis B, you should be screened for this virus. Talk with your health care provider to find out if you are at risk for hepatitis B infection. Hepatitis C Testing is recommended for:  Everyone born from 1945 through 1965.  Anyone with known risk factors for hepatitis C. Sexually   transmitted infections (STIs)  Get screened for STIs, including gonorrhea and chlamydia, if: ? You are sexually active and are younger than 60 years of age. ? You are older than 60 years of age and your health care provider tells you that you are at risk for this type of infection. ? Your sexual activity has changed since you were last screened, and you are at increased risk for chlamydia or gonorrhea. Ask your health care provider if you are at risk.  Ask your health  care provider about whether you are at high risk for HIV. Your health care provider may recommend a prescription medicine to help prevent HIV infection. If you choose to take medicine to prevent HIV, you should first get tested for HIV. You should then be tested every 3 months for as long as you are taking the medicine. Pregnancy  If you are about to stop having your period (premenopausal) and you may become pregnant, seek counseling before you get pregnant.  Take 400 to 800 micrograms (mcg) of folic acid every day if you become pregnant.  Ask for birth control (contraception) if you want to prevent pregnancy. Osteoporosis and menopause Osteoporosis is a disease in which the bones lose minerals and strength with aging. This can result in bone fractures. If you are 65 years old or older, or if you are at risk for osteoporosis and fractures, ask your health care provider if you should:  Be screened for bone loss.  Take a calcium or vitamin D supplement to lower your risk of fractures.  Be given hormone replacement therapy (HRT) to treat symptoms of menopause. Follow these instructions at home: Lifestyle  Do not use any products that contain nicotine or tobacco, such as cigarettes, e-cigarettes, and chewing tobacco. If you need help quitting, ask your health care provider.  Do not use street drugs.  Do not share needles.  Ask your health care provider for help if you need support or information about quitting drugs. Alcohol use  Do not drink alcohol if: ? Your health care provider tells you not to drink. ? You are pregnant, may be pregnant, or are planning to become pregnant.  If you drink alcohol: ? Limit how much you use to 0-1 drink a day. ? Limit intake if you are breastfeeding.  Be aware of how much alcohol is in your drink. In the [place] one drink equals one 12 oz bottle of beer (355 mL), one 5 oz glass of wine (148 mL), or one 1 oz glass of hard liquor (44 mL). General  instructions  Schedule regular health, dental, and eye exams.  Stay current with your vaccines.  Tell your health care provider if: ? You often feel depressed. ? You have ever been abused or do not feel safe at home. Summary  Adopting a healthy lifestyle and getting preventive care are important in promoting health and wellness.  Follow your health care provider's instructions about healthy diet, exercising, and getting tested or screened for diseases.  Follow your health care provider's instructions on monitoring your cholesterol and blood pressure. This information is not intended to replace advice given to you by your health care provider. Make sure you discuss any questions you have with your health care provider. Document Revised: 11/10/2018 Document Reviewed: 11/10/2018 [employer] Patient Education  2020 [employer]  

## 2020-08-01 LAB — CBC WITH DIFFERENTIAL/PLATELET
Absolute Monocytes: 429 cells/uL (ref 200–950)
Basophils Absolute: 42 cells/uL (ref 0–200)
Basophils Relative: 0.8 %
Eosinophils Absolute: 111 cells/uL (ref 15–500)
Eosinophils Relative: 2.1 %
HCT: 42 % (ref 35.0–45.0)
Hemoglobin: 14 g/dL (ref 11.7–15.5)
Lymphs Abs: 1362 cells/uL (ref 850–3900)
MCH: 30.9 pg (ref 27.0–33.0)
MCHC: 33.3 g/dL (ref 32.0–36.0)
MCV: 92.7 fL (ref 80.0–100.0)
MPV: 10.2 fL (ref 7.5–12.5)
Monocytes Relative: 8.1 %
Neutro Abs: 3355 cells/uL (ref 1500–7800)
Neutrophils Relative %: 63.3 %
Platelets: 248 10*3/uL (ref 140–400)
RBC: 4.53 10*6/uL (ref 3.80–5.10)
RDW: 12.5 % (ref 11.0–15.0)
Total Lymphocyte: 25.7 %
WBC: 5.3 10*3/uL (ref 3.8–10.8)

## 2020-08-01 LAB — COMPREHENSIVE METABOLIC PANEL
AG Ratio: 1.6 (calc) (ref 1.0–2.5)
ALT: 14 U/L (ref 6–29)
AST: 16 U/L (ref 10–35)
Albumin: 4.1 g/dL (ref 3.6–5.1)
Alkaline phosphatase (APISO): 86 U/L (ref 37–153)
BUN: 15 mg/dL (ref 7–25)
CO2: 28 mmol/L (ref 20–32)
Calcium: 9.6 mg/dL (ref 8.6–10.4)
Chloride: 105 mmol/L (ref 98–110)
Creat: 0.89 mg/dL (ref 0.50–0.99)
Globulin: 2.5 g/dL (calc) (ref 1.9–3.7)
Glucose, Bld: 90 mg/dL (ref 65–99)
Potassium: 4.5 mmol/L (ref 3.5–5.3)
Sodium: 139 mmol/L (ref 135–146)
Total Bilirubin: 0.4 mg/dL (ref 0.2–1.2)
Total Protein: 6.6 g/dL (ref 6.1–8.1)

## 2020-08-01 LAB — LIPID PANEL
Cholesterol: 242 mg/dL — ABNORMAL HIGH (ref <200)
HDL: 70 mg/dL (ref >=50)
LDL Cholesterol (Calc): 152 mg/dL (calc) — ABNORMAL HIGH
Non-HDL Cholesterol (Calc): 172 mg/dL (calc) — ABNORMAL HIGH (ref <130)
Total CHOL/HDL Ratio: 3.5 (calc) (ref <5.0)
Triglycerides: 91 mg/dL (ref <150)

## 2020-10-03 ENCOUNTER — Other Ambulatory Visit: Payer: Self-pay | Admitting: Physician Assistant

## 2021-01-21 ENCOUNTER — Ambulatory Visit (HOSPITAL_COMMUNITY): Payer: Managed Care, Other (non HMO) | Attending: Cardiovascular Disease

## 2021-01-21 ENCOUNTER — Other Ambulatory Visit: Payer: Self-pay

## 2021-01-21 DIAGNOSIS — Q225 Ebstein's anomaly: Secondary | ICD-10-CM | POA: Diagnosis not present

## 2021-01-21 DIAGNOSIS — Q2112 Patent foramen ovale: Secondary | ICD-10-CM

## 2021-01-21 DIAGNOSIS — Q211 Atrial septal defect: Secondary | ICD-10-CM | POA: Diagnosis not present

## 2021-01-21 LAB — ECHOCARDIOGRAM COMPLETE
Area-P 1/2: 4.77 cm2
P 1/2 time: 1100 msec
S' Lateral: 2.9 cm

## 2021-02-25 ENCOUNTER — Ambulatory Visit: Payer: Managed Care, Other (non HMO) | Admitting: Cardiology

## 2021-02-25 ENCOUNTER — Encounter: Payer: Self-pay | Admitting: Cardiology

## 2021-02-25 ENCOUNTER — Other Ambulatory Visit: Payer: Self-pay

## 2021-02-25 VITALS — BP 140/86 | HR 88 | Ht 69.0 in | Wt 286.4 lb

## 2021-02-25 DIAGNOSIS — Q225 Ebstein's anomaly: Secondary | ICD-10-CM

## 2021-02-25 DIAGNOSIS — Z7189 Other specified counseling: Secondary | ICD-10-CM | POA: Diagnosis not present

## 2021-02-25 DIAGNOSIS — Z6841 Body Mass Index (BMI) 40.0 and over, adult: Secondary | ICD-10-CM

## 2021-02-25 NOTE — Progress Notes (Signed)
Cardiology Office Note:    Date:  02/25/2021   ID:  Susan, Bender December 17, 1960, MRN [id number]  PCP:  Susan Motto, PA  Cardiologist:  Susan Red, MD PhD  Referring MD: Susan Bender, [place]   CC: follow up  History of Present Illness:    Susan Bender is a 60 y.o. female with a hx of Ebstein's anomaly who is seen for follow up. I initially saw her 01/25/19 as a new consult at the request of Susan Bender, [place] for the evaluation and management of Ebstein's.  Cardiac history: Diagnosed in her 53s, no prior cardiac history. 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 Checked on an ECG, saw fast rhythm, went to see provider. Had holter monitor placed and was placed on medication by an NP. (per Dr. Orie Bender notes, Holter showed isolated PACs and PVCs, was placed on beta blocker). Felt terrible on medication, referred to Dr. Griffith Citron in [place], [place]. Had normal stress test, then had echo that showed Ebstein's. Was referred to Dr. Deatra Bender at the [facility], has one visit there. Was followed annually by Dr. Griffith Citron with echo every 3-5 years.   Today: Struggling with weight loss, hip pain. Able to climb two flights of stairs, did this prior to visit today.  Works 4 ten hour shifts in the OR at [employer], exhausted after that. Trying to cut back to 8 hour shifts, hopes to have more energy after that.   Denies chest pain, shortness of breath at rest or with normal exertion. No PND, orthopnea, LE edema or unexpected weight gain. No syncope, rare palpitations, none recently.  Past Medical History:  Diagnosis Date  . Anxiety   . Ebstein anomaly    diagnosed in age 66's  . Endometriosis   . PFO (patent foramen ovale)     Past Surgical History:  Procedure Laterality Date  . BREAST BIOPSY Right   . BREAST EXCISIONAL BIOPSY Left    x2  . LASER LAPAROSCOPY    . WRIST SURGERY Right 2019     Current Medications: Current Outpatient Medications on File Prior to Visit  Medication Sig  . cimetidine (TAGAMET) 200 MG tablet Take 200 mg by mouth daily as needed.  . citalopram (CELEXA) 20 MG tablet TAKE 1 TABLET(20 MG) BY MOUTH DAILY  . Multiple Vitamins-Minerals (ONE-A-DAY WOMENS VITACRAVES) CHEW Chew 1 each by mouth daily.   No current facility-administered medications on file prior to visit.     Allergies:   Sulfa antibiotics   Social History   Tobacco Use  . Smoking status: Never Smoker  . Smokeless tobacco: Never Used  Vaping Use  . Vaping Use: Never used  Substance Use Topics  . Alcohol use: Yes    Comment: occassional glass of wine  . Drug use: Never    Family History: The patient's family history includes Alzheimer's disease in her mother; Arthritis in her mother; Asthma in her sister; Breast cancer in her paternal aunt; COPD in her sister; Diabetes in her father and sister; Heart attack in her father and paternal grandfather; Heart disease in her father and paternal grandfather; Hypertension in her father and sister; Parkinson's disease in her father. Dad had MI and CABG in his late 61s.   ROS:   Please see the history of present illness.  Additional pertinent ROS negative except as documented.    EKGs/Labs/Other Studies Reviewed:    The  following studies were reviewed today: Echo 01/21/21: 1. History of Epstein's anomaly. The TV is not well visualized but there  is mild TR. The RV and RA are normal size.  2. No evidence of PFO. If there are concerns for PFO, would recommend  bubble study.  3. Left ventricular ejection fraction, by estimation, is 55 to 60%. Left  ventricular ejection fraction by 3D volume is 56 %. The left ventricle has  normal function. The left ventricle has no regional wall motion  abnormalities. Left ventricular diastolic  parameters were normal.  4. Right ventricular systolic function is normal. The right ventricular  size is  normal. There is normal pulmonary artery systolic pressure. The  estimated right ventricular systolic pressure is 28.8 mmHg.  5. A small pericardial effusion is present. The pericardial effusion is  circumferential. There is no evidence of cardiac tamponade.  6. The mitral valve is grossly normal. Trivial mitral valve  regurgitation. No evidence of mitral stenosis.  7. The aortic valve is tricuspid. There is mild calcification of the  aortic valve. Aortic valve regurgitation is trivial. No aortic stenosis is  present.  8. The inferior vena cava is normal in size with greater than 50%  respiratory variability, suggesting right atrial pressure of 3 mmHg.   Echo from [facility] 2006: FINDINGS LEFT VENTRICLE -  EF = 55 +/- 5%. The left ventricle appears normal in size. Left ventricular systolic function is normal. RIGHT VENTRICLE - The right ventricle is normal in size and systolic function. LEFT ATRIUM/PULMONARY VEINS - The left atrium is normal. The left atrial cavity appears normal in size. The left atrial cavity area is 19.0 cm during systole. RIGHT ATRIUM/IVC/SVC - The right atrium is normal. MITRAL VALVE - The mitral valve is normal. TRICUSPID VALVE -  There is 2+ tricuspid regurgitation. Regurgitant velocity is 270 cm/s and estimated RV systolic pressure is 34 mm Hg. AORTIC VALVE/LVOT - This is a tricuspid aortic valve. There is 1+ aortic regurgitation. PULMONIC VALVE - The pulmonic valve is normal. AORTA - The aorta is normal. PULMONARY ARTERIES - The pulmonary arteries are normal. IAS/IVS - The interatrial and interventricular septa are normal. There is right to left flow through the interatrial septum PERICARDIUM - The pericardium is normal.  CONCLUSIONS 1. Normal LV size and function  2. Normal RV function 3. 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 Mild aortic regurgitation. 5. Positive saline microcavitation study but bubbles appear in  the LV after 3  cardiac cycles, more suggestive of intrapulmonary shunt.  EKG:  EKG is personally reviewed.  The ekg ordered today demonstrates normal sinus rhythm, no delta wave. Low anterior forces V1-V3. Unchanged from prior.  Recent Labs: 07/31/2020: ALT 14; BUN 15; Creat 0.89; Hemoglobin 14.0; Platelets 248; Potassium 4.5; Sodium 139  Recent Lipid Panel    Component Value Date/Time   CHOL 242 (H) 07/31/2020 1116   TRIG 91 07/31/2020 1116   HDL 70 07/31/2020 1116   CHOLHDL 3.5 07/31/2020 1116   VLDL 10.8 12/14/2018 0821   LDLCALC 152 (H) 07/31/2020 1116    Physical Exam:    VS:  BP 140/86   Pulse 88   Ht 5\' 9"  (1.753 m)   Wt 286 lb 6.4 oz (129.9 kg)   SpO2 96%   BMI 42.29 kg/m     Wt Readings from Last 3 Encounters:  02/25/21 286 lb 6.4 oz (129.9 kg)  07/31/20 275 lb 8 oz (125 kg)  03/18/20 265 lb (120.2 kg)    GEN: Well nourished, well developed in no acute distress HEENT: Normal, moist mucous membranes NECK: No JVD CARDIAC: regular rhythm, normal S1 and S2, no rubs or gallops. 1/6 HS murmur. VASCULAR: Radial and DP pulses 2+ bilaterally. No carotid bruits RESPIRATORY:  Clear to auscultation without rales, wheezing or rhonchi  ABDOMEN: Soft, non-tender, non-distended MUSCULOSKELETAL:  Ambulates independently SKIN: Warm and dry, no edema NEUROLOGIC:  Alert and oriented x 3. No focal neuro deficits noted. PSYCHIATRIC:  Normal affect   ASSESSMENT:    1. Ebstein anomaly   2. Class 3 severe obesity due to excess calories without serious comorbidity with body mass index (BMI) of 40.0 to 44.9 in adult [facility](HCC)   3. Cardiac risk counseling   4. Counseling on health promotion and disease prevention    PLAN:    Ebstein's anomaly: mild variant. Has not limited her. -no clinical symptoms of heart failure -Prior echo in 2006 was normal RV function. Reports last echo ~2017, I do not have a copy of this -echo 2021, due 2026. See results above. -no history of cyanosis or  paradoxical embolism -reported flow across IAS in 2006, though bubble study suggest intrapulmonary shunt. Possible PFO. -no significant arrhythmia seen on prior monitor evaluation. No delta wave on ECG.  -discussed Ebstein's, signs of right heart failure to watch for. No evidence of this today  Class 3 obesity, BMI 42: working on lifestyle, trying to make changes to work schedule to make this more realistic for her.  Primary prevention: -recommend heart healthy/Mediterranean diet, with whole grains, fruits, vegetable, fish, lean meats, nuts, and olive oil. Limit salt. -recommend moderate walking, 3-5 times/week for 30-50 minutes each session. Aim for at least 150 minutes.week. Goal should be pace of 3 miles/hours, or walking 1.5 miles in 30 minutes -recommend avoidance of tobacco products. Avoid excess alcohol. -Additional risk factor control:  -Diabetes: A1c is 5.9, working on lifestyle modification, no formal diagnosis yet of diabetes  -[facility] Tchol 242, TG 91, HDL 70, LDL 152  -Blood pressure control: on no meds. Recheck similar today, but had just climbed 2 flights of stairs for appt. -ASCVD risk score: The 10-year ASCVD risk score Denman George(Goff DC Montez HagemanJr., et al., 2013) is: 3.9%   Values used to calculate the score:     Age: [age] years     Sex: Female     Is Non-Hispanic African American: No     Diabetic: No     Tobacco smoker: No     Systolic Blood Pressure: 140 mmHg     Is BP treated: No     HDL Cholesterol: 70 mg/dL     Total Cholesterol: 242 mg/dL   Plan for follow up: 1 year or sooner PRN  Medication Adjustments/Labs and Tests Ordered: Current medicines are reviewed at length with the patient today.  Concerns regarding medicines are outlined above.  Orders Placed This Encounter  Procedures  . EKG 12-Lead   No orders of the defined types were placed in this encounter.   Patient Instructions  Medication Instructions:  Your Physician recommend you continue on your  current medication as directed.    *If you need a refill on your cardiac medications before your next appointment, please call your pharmacy*   Lab Work: None   Testing/Procedures: None   Follow-Up: At [facility], you and your health needs are our priority.  As part of our continuing mission to provide you with exceptional heart  care, we have created designated Provider Care Teams.  These Care Teams include your primary Cardiologist (physician) and Advanced Practice Providers (APPs -  Physician Assistants and Nurse Practitioners) who all work together to provide you with the care you need, when you need it.  We recommend signing up for the patient portal called "MyChart".  Sign up information is provided on this After Visit Summary.  MyChart is used to connect with patients for Virtual Visits (Telemedicine).  Patients are able to view lab/test results, encounter notes, upcoming appointments, etc.  Non-urgent messages can be sent to your provider as well.   To learn more about what you can do with MyChart, go to [email or web address].    Your next appointment:   1 year(s)  The format for your next appointment:   In Person  Provider:   Jodelle Red, MD        Signed, Susan Red, MD PhD 02/25/2021 9:46 AM    [facility]

## 2021-02-25 NOTE — Patient Instructions (Signed)

## 2021-03-26 ENCOUNTER — Other Ambulatory Visit: Payer: Self-pay | Admitting: Physician Assistant

## 2021-06-27 ENCOUNTER — Other Ambulatory Visit (HOSPITAL_BASED_OUTPATIENT_CLINIC_OR_DEPARTMENT_OTHER): Payer: Self-pay | Admitting: Physician Assistant

## 2021-06-27 DIAGNOSIS — Z1231 Encounter for screening mammogram for malignant neoplasm of breast: Secondary | ICD-10-CM

## 2021-07-17 ENCOUNTER — Ambulatory Visit (INDEPENDENT_AMBULATORY_CARE_PROVIDER_SITE_OTHER): Payer: Managed Care, Other (non HMO)

## 2021-07-17 ENCOUNTER — Other Ambulatory Visit: Payer: Self-pay

## 2021-07-17 DIAGNOSIS — Z1231 Encounter for screening mammogram for malignant neoplasm of breast: Secondary | ICD-10-CM

## 2021-08-02 ENCOUNTER — Other Ambulatory Visit: Payer: Self-pay

## 2021-08-02 ENCOUNTER — Ambulatory Visit (INDEPENDENT_AMBULATORY_CARE_PROVIDER_SITE_OTHER): Payer: Managed Care, Other (non HMO) | Admitting: Physician Assistant

## 2021-08-02 ENCOUNTER — Encounter: Payer: Self-pay | Admitting: Physician Assistant

## 2021-08-02 ENCOUNTER — Other Ambulatory Visit (HOSPITAL_COMMUNITY)
Admission: RE | Admit: 2021-08-02 | Discharge: 2021-08-02 | Disposition: A | Discharge status: Home / Self Care | Payer: Managed Care, Other (non HMO) | Source: Ambulatory Visit | Attending: Physician Assistant | Admitting: Physician Assistant

## 2021-08-02 VITALS — BP 129/85 | HR 65 | Temp 97.9°F | Ht 69.0 in | Wt 265.5 lb

## 2021-08-02 DIAGNOSIS — Z13220 Encounter for screening for lipoid disorders: Secondary | ICD-10-CM

## 2021-08-02 DIAGNOSIS — Z124 Encounter for screening for malignant neoplasm of cervix: Secondary | ICD-10-CM | POA: Diagnosis present

## 2021-08-02 DIAGNOSIS — E669 Obesity, unspecified: Secondary | ICD-10-CM | POA: Diagnosis not present

## 2021-08-02 DIAGNOSIS — Z0000 Encounter for general adult medical examination without abnormal findings: Secondary | ICD-10-CM

## 2021-08-02 DIAGNOSIS — Z136 Encounter for screening for cardiovascular disorders: Secondary | ICD-10-CM

## 2021-08-02 DIAGNOSIS — F419 Anxiety disorder, unspecified: Secondary | ICD-10-CM

## 2021-08-02 LAB — LIPID PANEL
Cholesterol: 183 mg/dL (ref 0–200)
HDL: 49.7 mg/dL (ref >39.00)
LDL Cholesterol: 122 mg/dL — ABNORMAL HIGH (ref 0–99)
NonHDL: 133.64
Total CHOL/HDL Ratio: 4
Triglycerides: 58 mg/dL (ref 0.0–149.0)
VLDL: 11.6 mg/dL (ref 0.0–40.0)

## 2021-08-02 LAB — CBC WITH DIFFERENTIAL/PLATELET
Basophils Absolute: 0 10*3/uL (ref 0.0–0.1)
Basophils Relative: 0.9 % (ref 0.0–3.0)
Eosinophils Absolute: 0.1 10*3/uL (ref 0.0–0.7)
Eosinophils Relative: 2.6 % (ref 0.0–5.0)
HCT: 44.3 % (ref 36.0–46.0)
Hemoglobin: 14.6 g/dL (ref 12.0–15.0)
Lymphocytes Relative: 33 % (ref 12.0–46.0)
Lymphs Abs: 1.2 10*3/uL (ref 0.7–4.0)
MCHC: 33 g/dL (ref 30.0–36.0)
MCV: 94.2 fl (ref 78.0–100.0)
Monocytes Absolute: 0.3 10*3/uL (ref 0.1–1.0)
Monocytes Relative: 9.6 % (ref 3.0–12.0)
Neutro Abs: 1.9 10*3/uL (ref 1.4–7.7)
Neutrophils Relative %: 53.9 % (ref 43.0–77.0)
Platelets: 176 10*3/uL (ref 150.0–400.0)
RBC: 4.71 Mil/uL (ref 3.87–5.11)
RDW: 13.7 % (ref 11.5–15.5)
WBC: 3.6 10*3/uL — ABNORMAL LOW (ref 4.0–10.5)

## 2021-08-02 LAB — COMPREHENSIVE METABOLIC PANEL
ALT: 18 U/L (ref 0–35)
AST: 20 U/L (ref 0–37)
Albumin: 4.2 g/dL (ref 3.5–5.2)
Alkaline Phosphatase: 72 U/L (ref 39–117)
BUN: 15 mg/dL (ref 6–23)
CO2: 28 mEq/L (ref 19–32)
Calcium: 9.7 mg/dL (ref 8.4–10.5)
Chloride: 103 mEq/L (ref 96–112)
Creatinine, Ser: 0.91 mg/dL (ref 0.40–1.20)
GFR: 68.33 mL/min (ref >60.00)
Glucose, Bld: 88 mg/dL (ref 70–99)
Potassium: 3.9 mEq/L (ref 3.5–5.1)
Sodium: 139 mEq/L (ref 135–145)
Total Bilirubin: 0.6 mg/dL (ref 0.2–1.2)
Total Protein: 6.9 g/dL (ref 6.0–8.3)

## 2021-08-02 MED ORDER — CITALOPRAM HYDROBROMIDE 20 MG PO TABS: 20.0000 mg | ORAL_TABLET | Freq: Every day | ORAL | 3 refills | Status: DC

## 2021-08-02 NOTE — Patient Instructions (Addendum)
It was great to see you!  Call your insurance about the shingles vaccine Please get your flu vaccine through work  Continue to work on healthy diet and exercise  Please go to the lab for blood work.   Our office will call you with your results unless you have chosen to receive results via MyChart.  If your blood work is normal we will follow-up each year for physicals and as scheduled for chronic medical problems.  If anything is abnormal we will treat accordingly and get you in for a follow-up.  Take care,  Lelon Mast

## 2021-08-02 NOTE — Progress Notes (Signed)
Subjective:    Susan Bender is a 60 y.o. female and is here for a comprehensive physical exam.   HPI  Health Maintenance Due  Topic Date Due   Zoster Vaccines- Shingrix (1 of 2) Never done   PAP SMEAR-Modifier  03/05/2021   INFLUENZA VACCINE  07/01/2021    Acute Concerns: None  Chronic Issues: Anxiety/depression -- taking celexa 20 mg; doing well; does not see therapist and does not feel like she needs to  Health Maintenance: Immunizations -- flu shot to be done at work; advised to ask insurance about shingrix Colonoscopy -- 2018 -- due in 2028 Mammogram -- 2022 PAP -- today Bone Density -- n/a Diet -- [employer] meal plan has helped her lose significant weight Sleep habits -- sleeping well Exercise -- exercises regularly with walking Weight -- Weight: 265 lb 8 oz (120.4 kg)  Mood -- overall stable Weight history: Wt Readings from Last 10 Encounters:  08/02/21 265 lb 8 oz (120.4 kg)  02/25/21 286 lb 6.4 oz (129.9 kg)  07/31/20 275 lb 8 oz (125 kg)  03/18/20 265 lb (120.2 kg)  01/23/20 265 lb (120.2 kg)  01/25/19 267 lb 6.4 oz (121.3 kg)  12/14/18 262 lb (118.8 kg)  12/06/18 261 lb 8 oz (118.6 kg)   Body mass index is 39.21 kg/m. No LMP recorded. Patient is postmenopausal. Alcohol use:  reports current alcohol use. Tobacco use:  Tobacco Use: Low Risk    Smoking Tobacco Use: Never   Smokeless Tobacco Use: Never   UTD with eye doctor Needs to see dentist  Depression screen [facility] 2/9 08/02/2021  Decreased Interest 0  Down, Depressed, Hopeless 0  PHQ - 2 Score 0     Other providers/specialists: Patient Care Team: Jarold Motto, [place] as PCP - General (Physician Assistant)    PMHx, SurgHx, SocialHx, Medications, and Allergies were reviewed in the Visit Navigator and updated as appropriate.   Past Medical History:  Diagnosis Date   Anxiety    Ebstein anomaly    diagnosed in age 52's   Endometriosis    PFO (patent foramen ovale)      Past  Surgical History:  Procedure Laterality Date   BREAST BIOPSY Right    BREAST EXCISIONAL BIOPSY Left    x2   LASER LAPAROSCOPY     WRIST SURGERY Right 2019     Family History  Problem Relation Age of Onset   Arthritis Mother    Alzheimer's disease Mother    Diabetes Father    Heart attack Father    Heart disease Father    Hypertension Father    Parkinson's disease Father    COPD Sister    Diabetes Sister    Hypertension Sister    Heart attack Paternal Grandfather    Heart disease Paternal Grandfather    Asthma Sister    Breast cancer Paternal Aunt     Social History   Tobacco Use   Smoking status: Never   Smokeless tobacco: Never  Vaping Use   Vaping Use: Never used  Substance Use Topics   Alcohol use: Yes    Comment: occassional glass of wine   Drug use: Never    Review of Systems:   Review of Systems  Constitutional:  Negative for chills, fever, malaise/fatigue and weight loss.  HENT:  Negative for hearing loss, sinus pain and sore throat.   Respiratory:  Negative for cough and hemoptysis.   Cardiovascular:  Negative for chest pain, palpitations,  leg swelling and PND.  Gastrointestinal:  Negative for abdominal pain, constipation, diarrhea, heartburn, nausea and vomiting.  Genitourinary:  Negative for dysuria, frequency and urgency.  Musculoskeletal:  Negative for back pain, myalgias and neck pain.  Skin:  Negative for itching and rash.  Neurological:  Negative for dizziness, tingling, seizures and headaches.  Endo/Heme/Allergies:  Negative for polydipsia.  Psychiatric/Behavioral:  Negative for depression. The patient is not nervous/anxious.    Objective:   BP 129/85   Pulse 65   Temp 97.9 F (36.6 C) (Temporal)   Ht 5\' 9"  (1.753 m)   Wt 265 lb 8 oz (120.4 kg)   SpO2 97%   BMI 39.21 kg/m  Body mass index is 39.21 kg/m.   General Appearance:    Alert, cooperative, no distress, appears stated age  Head:    Normocephalic, without obvious  abnormality, atraumatic  Eyes:    PERRL, conjunctiva/corneas clear, EOM's intact, fundi    benign, both eyes  Ears:    Normal TM's and external ear canals, both ears  Nose:   Nares normal, septum midline, mucosa normal, no drainage    or sinus tenderness  Throat:   Lips, mucosa, and tongue normal; teeth and gums normal  Neck:   Supple, symmetrical, trachea midline, no adenopathy;    thyroid:  no enlargement/tenderness/nodules; no carotid   bruit or JVD  Back:     Symmetric, no curvature, ROM normal, no CVA tenderness  Lungs:     Clear to auscultation bilaterally, respirations unlabored  Chest Wall:    No tenderness or deformity   Heart:    Regular rate and rhythm, S1 and S2 normal, no murmur, rub or gallop  Breast Exam:    Defer  Abdomen:     Soft, non-tender, bowel sounds active all four quadrants,    no masses, no organomegaly  Genitalia:    Normal female without lesion, discharge or tenderness  Extremities:   Extremities normal, atraumatic, no cyanosis or edema  Pulses:   2+ and symmetric all extremities  Skin:   Skin color, texture, turgor normal, no rashes or lesions  Lymph nodes:   Cervical, supraclavicular, and axillary nodes normal  Neurologic:   CNII-XII intact, normal strength, sensation and reflexes    throughout    Assessment/Plan:   1. Routine physical examination Today patient counseled on age appropriate routine health concerns for screening and prevention, each reviewed and up to date or declined. Immunizations reviewed and up to date or declined. Labs ordered and reviewed. Risk factors for depression reviewed and negative. Hearing function and visual acuity are intact. ADLs screened and addressed as needed. Functional ability and level of safety reviewed and appropriate. Education, counseling and referrals performed based on assessed risks today. Patient provided with a copy of personalized plan for preventive services.  2. Obesity (BMI 30-39.9) Continue to work  towards healthy lifestyle  3. Anxiety Well controlled with celexa 20 mg daily Follow-up in 74mo to 73yr, sooner if concerns  4. Encounter for lipid screening for cardiovascular disease Update lipid panel and provide recommendations accordingly  5. Pap smear for cervical cancer screening Completed today    Patient Counseling: [x]    Nutrition: Stressed importance of moderation in sodium/caffeine intake, saturated fat and cholesterol, caloric balance, sufficient intake of fresh fruits, vegetables, fiber, calcium, iron, and 1 mg of folate supplement per day (for females capable of pregnancy).  [x]    Stressed the importance of regular exercise.   [x]    Substance Abuse: Discussed cessation/primary  prevention of tobacco, alcohol, or other drug use; driving or other dangerous activities under the influence; availability of treatment for abuse.   [x]    Injury prevention: Discussed safety belts, safety helmets, smoke detector, smoking near bedding or upholstery.   [x]    Sexuality: Discussed sexually transmitted diseases, partner selection, use of condoms, avoidance of unintended pregnancy  and contraceptive alternatives.  [x]    Dental health: Discussed importance of regular tooth brushing, flossing, and dental visits.  [x]    Health maintenance and immunizations reviewed. Please refer to Health maintenance section.    , PA-C [facility]

## 2021-08-08 LAB — CYTOLOGY - PAP
Comment: NEGATIVE
Diagnosis: NEGATIVE
High risk HPV: NEGATIVE

## 2021-09-18 IMAGING — MG MM DIGITAL SCREENING BILAT W/ TOMO AND CAD
8 series · 8 of 24 positions shown · non-contrast
Comparison: Previous exam(s).

CLINICAL DATA: Screening.

EXAM:
DIGITAL SCREENING BILATERAL MAMMOGRAM WITH TOMOSYNTHESIS AND CAD
TECHNIQUE: Bilateral screening digital craniocaudal and mediolateral oblique
mammograms were obtained. Bilateral screening digital breast
tomosynthesis was performed. The images were evaluated with
computer-aided detection.

[L CC synth-2D]
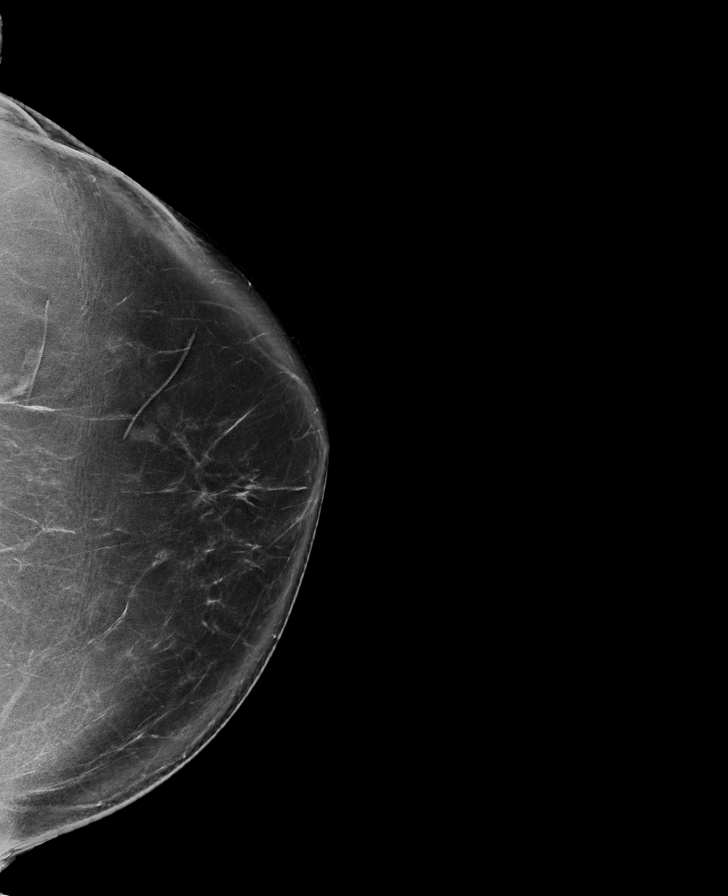

[L MLO synth-2D]
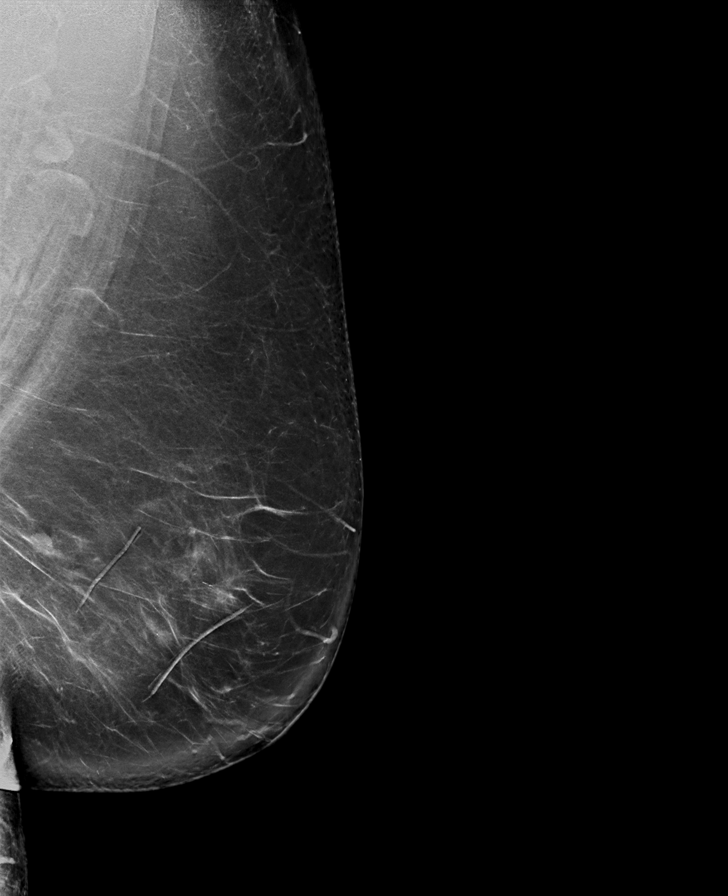

[R CC synth-2D]
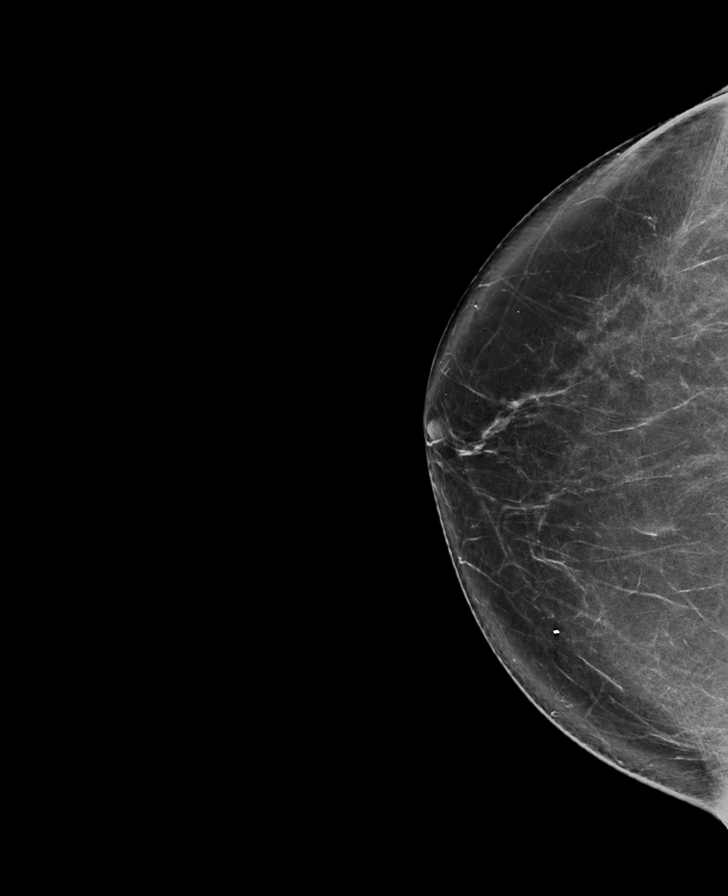

[R MLO synth-2D]
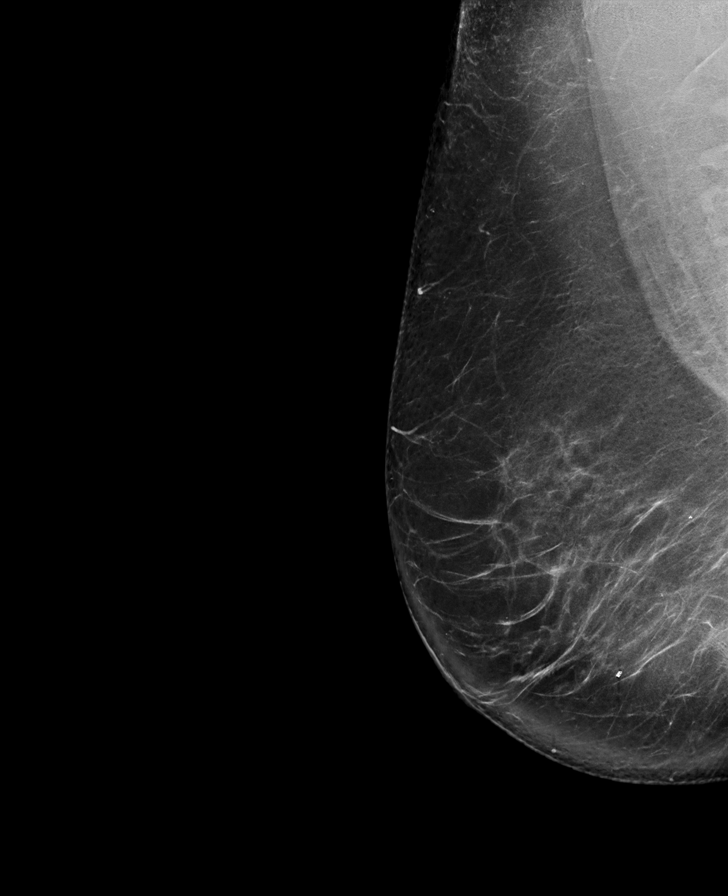

[R MLO tomo · tomo slice 51/100.0]
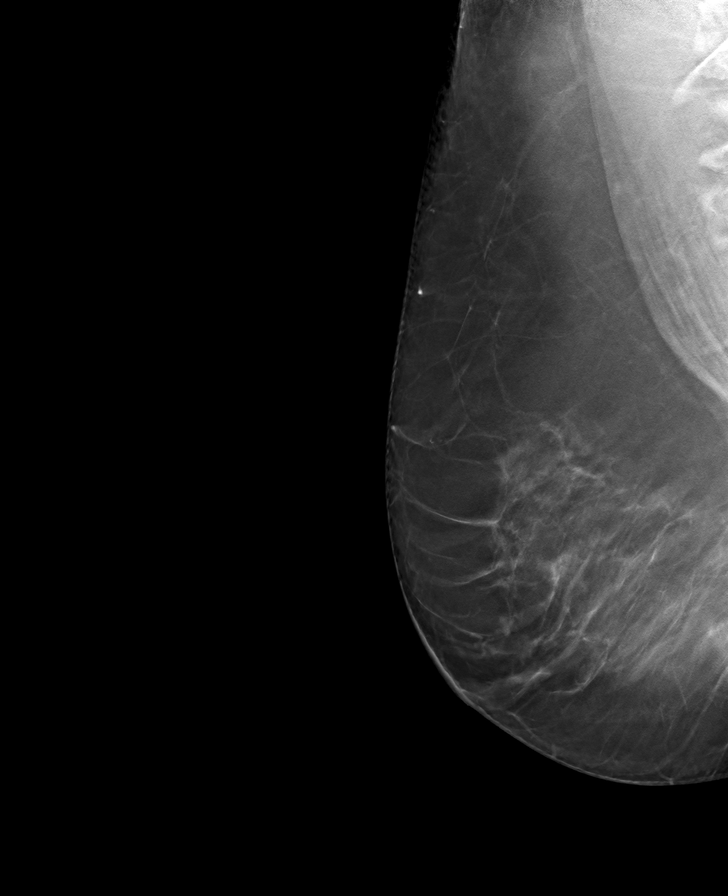

[L CC tomo · tomo slice 53/105.0]
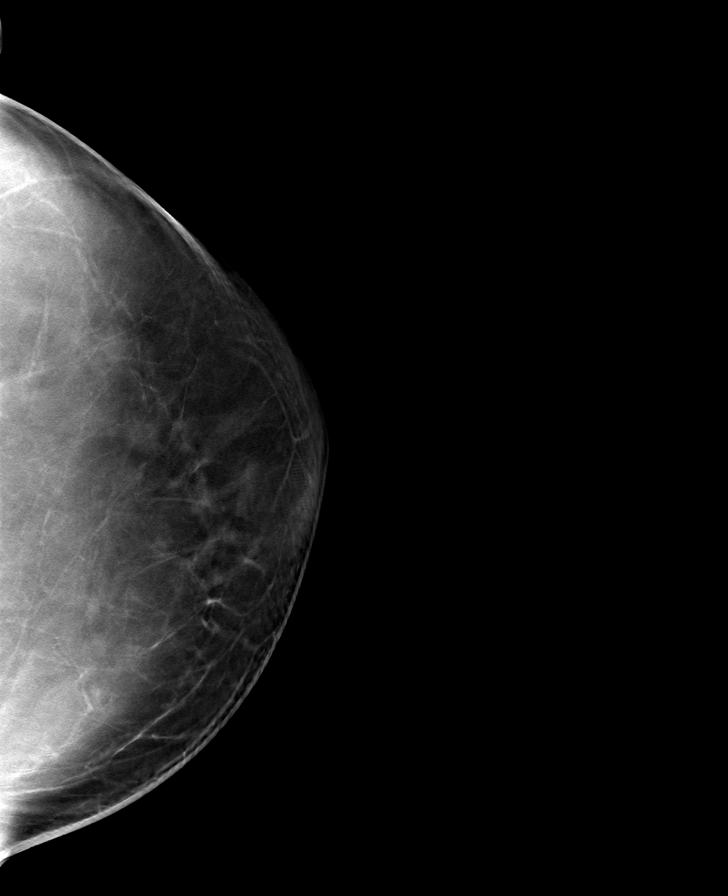

[L MLO tomo · tomo slice 52/103.0]
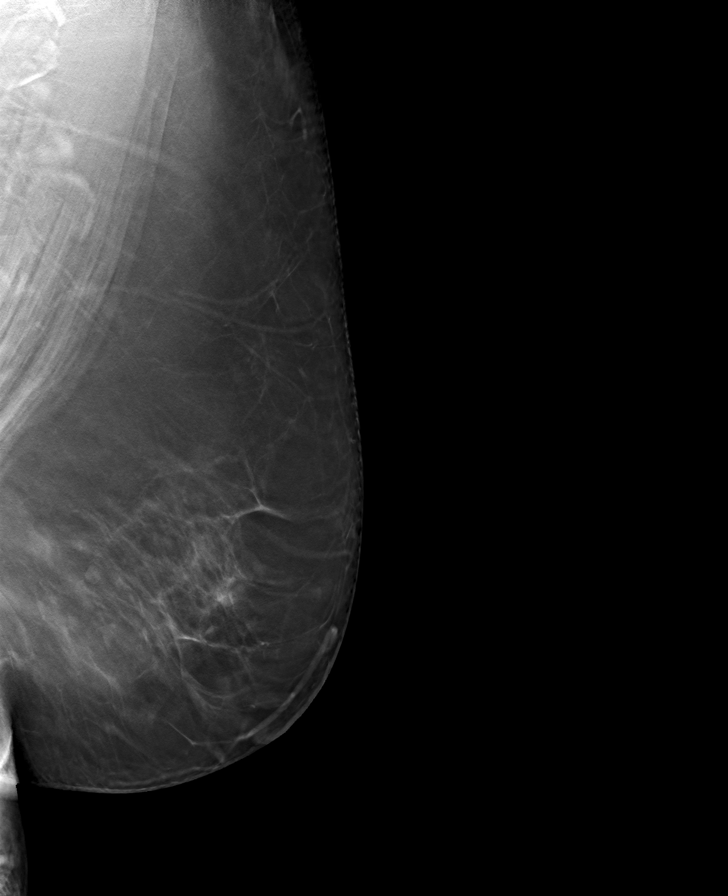

[R CC tomo · tomo slice 46/91.0]
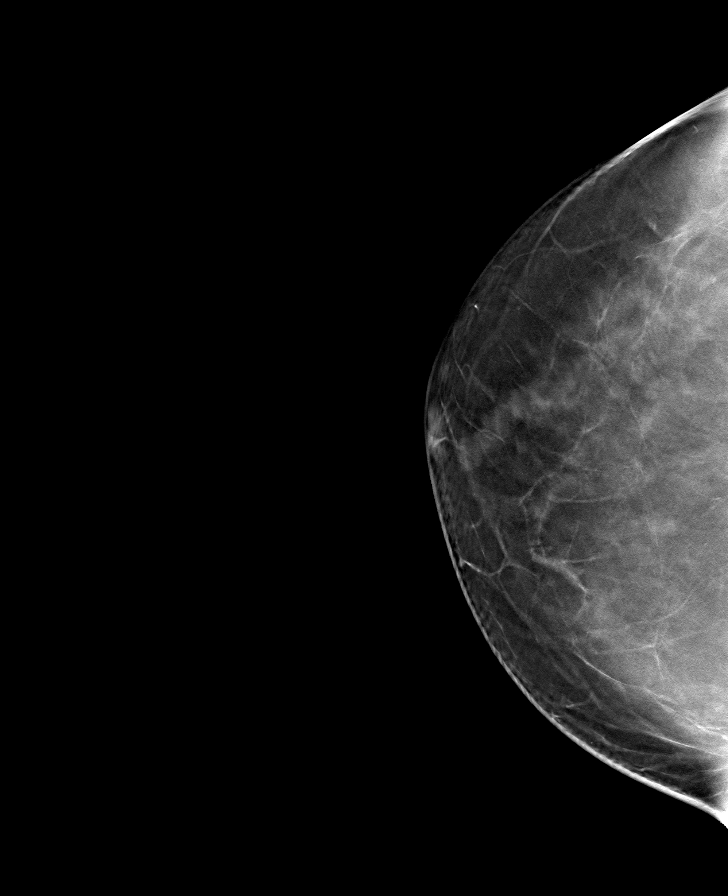

[8 of 24 positions shown; findings below may reference images not displayed]

ACR Breast Density Category b: There are scattered areas of
fibroglandular density.
FINDINGS: There are no findings suspicious for malignancy.
IMPRESSION: No mammographic evidence of malignancy. A result letter of this
screening mammogram will be mailed directly to the patient.

RECOMMENDATION:
Screening mammogram in one year. (Code:51-O-LD2)

BI-RADS CATEGORY  1: Negative.

## 2022-03-05 ENCOUNTER — Ambulatory Visit (HOSPITAL_BASED_OUTPATIENT_CLINIC_OR_DEPARTMENT_OTHER): Payer: Managed Care, Other (non HMO) | Admitting: Cardiology

## 2022-03-05 ENCOUNTER — Encounter (HOSPITAL_BASED_OUTPATIENT_CLINIC_OR_DEPARTMENT_OTHER): Payer: Self-pay | Admitting: Cardiology

## 2022-03-05 VITALS — BP 128/78 | HR 86 | Ht 69.0 in | Wt 266.0 lb

## 2022-03-05 DIAGNOSIS — Q225 Ebstein's anomaly: Secondary | ICD-10-CM

## 2022-03-05 DIAGNOSIS — Z6839 Body mass index (BMI) 39.0-39.9, adult: Secondary | ICD-10-CM

## 2022-03-05 DIAGNOSIS — E6609 Other obesity due to excess calories: Secondary | ICD-10-CM

## 2022-03-05 DIAGNOSIS — Z7189 Other specified counseling: Secondary | ICD-10-CM

## 2022-03-05 NOTE — Progress Notes (Signed)
?Cardiology Office Note:   ? ?Date:  03/07/2022  ? ?ID:  Susan Bender, DOB 23-May-1961, MRN [id number] ? ?PCP:  Jarold Motto, PA  ?Cardiologist:  Jodelle Red, MD PhD ? ?Referring MD: Jarold Motto, PA  ? ?CC: follow up ? ?History of Present Illness:   ? ?Susan Bender is a 61 y.o. female with a hx of Ebstein's anomaly who is seen for follow up. I initially saw her 01/25/19 as a new consult at the request of Jarold Motto, [place] for the evaluation and management of Ebstein's. ? ?Cardiac history: Diagnosed in her 65s, no prior cardiac history. 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 Checked on an ECG, saw fast rhythm, went to see provider. Had holter monitor placed and was placed on medication by an NP. (per Dr. Orie Rout notes, Holter showed isolated PACs and PVCs, was placed on beta blocker). Felt terrible on medication, referred to Dr. Griffith Citron in [place], [place]. Had normal stress test, then had echo that showed Ebstein's. Was referred to Dr. Deatra James at the [facility], has one visit there. Was followed annually by Dr. Griffith Citron with echo every 3-5 years.  ? ? ?Today,  ?Overall, she is feeling good. She says that she is not concerned about anything other than her hip pain. She states that she likes to walk but is limited to walking due to her hip pain. She says that she knows what she needs to do, but can't. She also works in her yard and if she works too much, the hip pains worsen. She is currently taking Mobic for her hip pain which helps. ? ?Occasionally she has palpitations which will occur while she is working, causing her to cough. She will sometimes describe her heart palpitations as a Therapist, music." Some days the palpitations occur frequently and other days she does not feel them at all. ? ?In July- November, she lost 45 lbs when she was trying the [employer] and regained some of her weight after Christmas. The  patient reports she comes home and cook salmon, but believes that her "sweet tooth" caused her to eat unhealthier food, resulting her to over indulge. Also she will eat protein bars.  ? ?She denies any chest pain, shortness of breath, or peripheral edema. No lightheadedness, headaches, syncope, orthopnea, or PND.  ? ?Past Medical History:  ?Diagnosis Date  ? Anxiety   ? Ebstein anomaly   ? diagnosed in age 49's  ? Endometriosis   ? PFO (patent foramen ovale)   ? ? ?Past Surgical History:  ?Procedure Laterality Date  ? BREAST BIOPSY Right   ? BREAST EXCISIONAL BIOPSY Left   ? x2  ? LASER LAPAROSCOPY    ? WRIST SURGERY Right 2019  ? ? ?Current Medications: ?Current Outpatient Medications on File Prior to Visit  ?Medication Sig  ? cimetidine (TAGAMET) 200 MG tablet Take 200 mg by mouth daily as needed.  ? citalopram (CELEXA) 20 MG tablet Take 1 tablet (20 mg total) by mouth daily.  ? meloxicam (MOBIC) 7.5 MG tablet   ? Multiple Vitamins-Minerals (ONE-A-DAY WOMENS VITACRAVES) CHEW Chew 1 each by mouth daily.  ? ?No current facility-administered medications on file prior to visit.  ?  ? ?Allergies:   Sulfa antibiotics  ? ?Social History  ? ?Tobacco Use  ? Smoking status: Never  ? Smokeless tobacco: Never  ?Vaping Use  ? Vaping Use: Never used  ?  Substance Use Topics  ? Alcohol use: Yes  ?  Comment: occassional glass of wine  ? Drug use: Never  ? ? ?Family History: ?The patient's family history includes Alzheimer's disease in her mother; Arthritis in her mother; Asthma in her sister; Breast cancer in her paternal aunt; COPD in her sister; Diabetes in her father and sister; Heart attack in her father and paternal grandfather; Heart disease in her father and paternal grandfather; Hypertension in her father and sister; Parkinson's disease in her father. Dad had MI and CABG in his late 52s.  ? ?ROS:   ?Please see the history of present illness.   ?(+) Palpitations ?(+) Hip Pain ?Additional pertinent ROS negative except as  documented.  ? ? ?EKGs/Labs/Other Studies Reviewed:   ? ?The following studies were reviewed today: ? ?Echo 01/21/21: ?1. History of Epstein's anomaly. The TV is not well visualized but there  ?is mild TR. The RV and RA are normal size.  ? 2. No evidence of PFO. If there are concerns for PFO, would recommend  ?bubble study.  ? 3. Left ventricular ejection fraction, by estimation, is 55 to 60%. Left  ?ventricular ejection fraction by 3D volume is 56 %. The left ventricle has  ?normal function. The left ventricle has no regional wall motion  ?abnormalities. Left ventricular diastolic  ? parameters were normal.  ? 4. Right ventricular systolic function is normal. The right ventricular  ?size is normal. There is normal pulmonary artery systolic pressure. The  ?estimated right ventricular systolic pressure is 28.8 mmHg.  ? 5. A small pericardial effusion is present. The pericardial effusion is  ?circumferential. There is no evidence of cardiac tamponade.  ? 6. The mitral valve is grossly normal. Trivial mitral valve  ?regurgitation. No evidence of mitral stenosis.  ? 7. The aortic valve is tricuspid. There is mild calcification of the  ?aortic valve. Aortic valve regurgitation is trivial. No aortic stenosis is  ?present.  ? 8. The inferior vena cava is normal in size with greater than 50%  ?respiratory variability, suggesting right atrial pressure of 3 mmHg.  ? ?Echo from [facility] 2006: ?FINDINGS ?LEFT VENTRICLE -   EF = 55 +/- 5%.  The left ventricle appears  normal in size.  Left ventricular systolic function is normal. ?RIGHT VENTRICLE - The right ventricle is normal in size and systolic function. ?LEFT ATRIUM/PULMONARY VEINS - The left atrium is normal.  The left atrial cavity appears normal in size.  The left atrial cavity area is 19.0 cm during systole. ?RIGHT ATRIUM/IVC/SVC - The right atrium is normal. ?MITRAL VALVE - The mitral valve is normal. ?TRICUSPID VALVE -   There is 2+ tricuspid regurgitation.  Regurgitant  velocity is 270 cm/s and estimated RV systolic pressure is 34 mm Hg. ?AORTIC VALVE/LVOT - This is a tricuspid aortic valve.  There is 1+ aortic regurgitation. ?PULMONIC VALVE - The pulmonic valve is normal. ?AORTA - The aorta is normal. ?PULMONARY ARTERIES - The pulmonary arteries are normal. ?IAS/IVS - The interatrial and interventricular septa are normal.  There is right to left flow through the interatrial septum ?PERICARDIUM - The pericardium is normal. ? ?CONCLUSIONS ?1. Normal LV size and function  ?2. Normal RV function ?3. Mild Ebsteins anomoly with mild tethering of the septal leaflet & 2+ TR ?4. Mild aortic regurgitation. ?5. Positive saline microcavitation study but bubbles appear in the LV after 3 ? cardiac cycles, more suggestive of intrapulmonary shunt. ? ?EKG:  EKG is personally reviewed.   ?  03/05/2022 :NSR at 86 bpm ?02/25/2021: Normal sinus rhythm, no delta wave. Low anterior forces V1-V3. Unchanged from prior. ? ?Recent Labs: ?08/02/2021: ALT 18; BUN 15; Creatinine, Ser 0.91; Hemoglobin 14.6; Platelets 176.0; Potassium 3.9; Sodium 139  ?Recent Lipid Panel ?   ?Component Value Date/Time  ? CHOL 183 08/02/2021 0922  ? TRIG 58.0 08/02/2021 0922  ? HDL 49.70 08/02/2021 0922  ? CHOLHDL 4 08/02/2021 0922  ? VLDL 11.6 08/02/2021 0922  ? LDLCALC 122 (H) 08/02/2021 [id number]  ? LDLCALC 152 (H) 07/31/2020 1116  ? ? ?Physical Exam:   ? ?VS:  BP 128/78   Pulse 86   Ht 5\' 9"  (1.753 m)   Wt 266 lb (120.7 kg)   SpO2 99%   BMI 39.28 kg/m?    ? ?Wt Readings from Last 3 Encounters:  ?03/05/22 266 lb (120.7 kg)  ?08/02/21 265 lb 8 oz (120.4 kg)  ?02/25/21 286 lb 6.4 oz (129.9 kg)  ?  ?GEN: Well nourished, well developed in no acute distress ?HEENT: Normal, moist mucous membranes ?NECK: No JVD ?CARDIAC: regular rhythm, normal S1 and S2, no rubs or gallops. 1/6 HS murmur. ?VASCULAR: Radial and DP pulses 2+ bilaterally. No carotid bruits ?RESPIRATORY:  Clear to auscultation without rales, wheezing or rhonchi  ?ABDOMEN: Soft,  non-tender, non-distended ?MUSCULOSKELETAL:  Ambulates independently ?SKIN: Warm and dry, no edema ?NEUROLOGIC:  Alert and oriented x 3. No focal neuro deficits noted. ?PSYCHIATRIC:  Normal affect  ? ?ASSESSMENT:

## 2022-03-05 NOTE — Patient Instructions (Signed)

## 2022-03-07 ENCOUNTER — Encounter (HOSPITAL_BASED_OUTPATIENT_CLINIC_OR_DEPARTMENT_OTHER): Payer: Self-pay | Admitting: Cardiology

## 2022-05-01 HISTORY — PX: REPLACEMENT TOTAL HIP W/  RESURFACING IMPLANTS: SUR1222

## 2022-05-29 ENCOUNTER — Telehealth: Payer: Self-pay | Admitting: *Deleted

## 2022-05-29 NOTE — Telephone Encounter (Signed)
Transition Care Management Follow-up Telephone Call Date of discharge and from where: 05/28/22 FROM [facility] How have you been since you were released from the hospital? PRETTY GOOD, PAIN NOT TO BAD TRYING TO DO EXERCISES WAS TAUGHT TO DO Any questions or concerns? No  Items Reviewed: Did the pt receive and understand the discharge instructions provided? Yes  Medications obtained and verified? Yes  Other? No  Any new allergies since your discharge? No  Dietary orders reviewed? Yes Do you have support at home? Yes   Home Care and Equipment/Supplies: Were home health services ordered? no If so, what is the name of the agency? NA  Has the agency set up a time to come to the patient's home? no Were any new equipment or medical supplies ordered?  No What is the name of the medical supply agency? NA Were you able to get the supplies/equipment? not applicable Do you have any questions related to the use of the equipment or supplies? No  Functional Questionnaire: (I = Independent and D = Dependent) ADLs: I  Bathing/Dressing- I  Meal Prep- I  Eating- I  Maintaining continence- I BUT SOME LEAKAGE AT NIGHT DUE TO MOVING SLOWER  Transferring/Ambulation- I  Managing Meds- I  Follow up appointments reviewed:  PCP Hospital f/u appt confirmed? Yes  Scheduled to see Jarold Motto PA on 7/7 @ 1400. Specialist Hospital f/u appt confirmed? Yes  Scheduled to see CAST TECH/ DR. HENDERSON  on 06/09/22 &  06/23/22   @ PATIENT UNSURE OF TIMES BUT STATES IT IS ON HER PAPERWORK. Are transportation arrangements needed? No  If their condition worsens, is the pt aware to call PCP or go to the Emergency Dept.? Yes Was the patient provided with contact information for the PCP's office or ED? Yes Was to pt encouraged to call back with questions or concerns? Yes   Rhae Lerner RN, MSN RN Care Management Coordinator  [facility] [place] [phone] [email or web address]

## 2022-06-06 ENCOUNTER — Encounter: Payer: Self-pay | Admitting: Physician Assistant

## 2022-06-06 ENCOUNTER — Ambulatory Visit (INDEPENDENT_AMBULATORY_CARE_PROVIDER_SITE_OTHER): Payer: Managed Care, Other (non HMO) | Admitting: Physician Assistant

## 2022-06-06 VITALS — BP 120/80 | HR 70 | Temp 98.1°F | Ht 69.0 in | Wt 271.0 lb

## 2022-06-06 DIAGNOSIS — N3941 Urge incontinence: Secondary | ICD-10-CM | POA: Diagnosis not present

## 2022-06-06 DIAGNOSIS — Z96642 Presence of left artificial hip joint: Secondary | ICD-10-CM | POA: Diagnosis not present

## 2022-06-06 NOTE — Progress Notes (Signed)
Susan Bender is a 61 y.o. female here for a new problem.  History of Present Illness:   Chief Complaint  Patient presents with   Hospitalization Follow-up    Pt had surgery on 6/27 Left Total Hip Replacement.   Urinary urge incontinence    Pt c/o urge incontinence x several months, worse at night.    HPI  L total hip replacement Underwent ERAS L total hip arthroplasty anterior approach by Dr. Waylan Boga on 05/27/22. She is doing well. Doing PT exercises on her own at home. Planning to return to work FT in early August. Sister has been helping her out but now she is pretty much on her own. Denies concerns. She had some swelling of her leg that resulted in her going to the ER on 06/01/22 but she was negative for DVT.  Urge incontinence Urgency for a few months. Had some associated constipation, but this has improved.  Denies vaginal pain, discharge, vaginal bleeding, dysuria. She feels like she is unable to make it to the restroom.Marland Kitchen Has started to limit her water because of this. 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     Past Medical History:  Diagnosis Date   Anxiety    Ebstein anomaly    diagnosed in age 78's   Endometriosis    PFO (patent foramen ovale)      Social History   Tobacco Use   Smoking status: Never   Smokeless tobacco: Never  Vaping Use   Vaping Use: Never used  Substance Use Topics   Alcohol use: Yes    Comment: occassional glass of wine   Drug use: Never    Past Surgical History:  Procedure Laterality Date   BREAST BIOPSY Right    BREAST EXCISIONAL BIOPSY Left    x2   LASER LAPAROSCOPY     WRIST SURGERY Right 2019    Family History  Problem Relation Age of Onset   Arthritis Mother    Alzheimer's disease Mother    Diabetes Father    Heart attack Father    Heart disease Father    Hypertension Father    Parkinson's disease Father    COPD Sister    Diabetes Sister    Hypertension Sister    Heart attack Paternal  Grandfather    Heart disease Paternal Grandfather    Asthma Sister    Breast cancer Paternal Aunt     Allergies  Allergen Reactions   Sulfa Antibiotics Hives and Rash    Current Medications:   Current Outpatient Medications:    aspirin 81 MG chewable tablet, Chew by mouth., Disp: , Rfl:    cimetidine (TAGAMET) 200 MG tablet, Take 200 mg by mouth daily as needed., Disp: , Rfl:    citalopram (CELEXA) 20 MG tablet, Take 1 tablet (20 mg total) by mouth daily., Disp: 90 tablet, Rfl: 3   docusate sodium (COLACE) 100 MG capsule, Take 100 mg by mouth 2 (two) times daily., Disp: , Rfl:    HYDROcodone-acetaminophen (NORCO/VICODIN) 5-325 MG tablet, Take 1 tablet by mouth every 4 (four) hours as needed., Disp: , Rfl:    ondansetron (ZOFRAN) 4 MG tablet, Take by mouth., Disp: , Rfl:    pregabalin (LYRICA) 75 MG capsule, Take 75 mg by mouth 2 (two) times daily., Disp: , Rfl:    Multiple Vitamins-Minerals (ONE-A-DAY WOMENS VITACRAVES) CHEW, Chew 1 each by mouth daily. (Patient not taking: Reported on 06/06/2022), Disp: , Rfl:    Review of  Systems:   ROS Negative unless otherwise specified per HPI.  Vitals:   Vitals:   06/06/22 1408  BP: 120/80  Pulse: 70  Temp: 98.1 F (36.7 C)  TempSrc: Temporal  SpO2: 98%  Weight: 271 lb (122.9 kg)  Height: 5\' 9"  (1.753 m)     Body mass index is 40.02 kg/m.  Physical Exam:   Physical Exam Vitals and nursing note reviewed.  Constitutional:      General: She is not in acute distress.    Appearance: She is well-developed. She is not ill-appearing or toxic-appearing.  Cardiovascular:     Rate and Rhythm: Normal rate and regular rhythm.     Pulses: Normal pulses.     Heart sounds: Normal heart sounds, S1 normal and S2 normal.  Pulmonary:     Effort: Pulmonary effort is normal.     Breath sounds: Normal breath sounds.  Skin:    General: Skin is warm and dry.  Neurological:     Mental Status: She is alert.     GCS: GCS eye subscore is 4. GCS  verbal subscore is 5. GCS motor subscore is 6.  Psychiatric:        Speech: Speech normal.        Behavior: Behavior normal. Behavior is cooperative.     Assessment and Plan:   History of left hip replacement Doing well Denies any immediate concerns Compliant with surgeon's recommendations RTW per surgery  Urge incontinence Uncontrolled No red flags Will check UA today -- will treat accordingly Provided bladder diary and handout Recommend trialing these as well as pelvic floor PT -- she is agreeable to this referral Follow-up with prn   [place], PA-C

## 2022-06-07 LAB — URINALYSIS, ROUTINE W REFLEX MICROSCOPIC
Bacteria, UA: NONE SEEN /HPF
Bilirubin Urine: NEGATIVE
Crystals: NONE SEEN /HPF
Glucose, UA: NEGATIVE
Hgb urine dipstick: NEGATIVE
Hyaline Cast: NONE SEEN /LPF
Ketones, ur: NEGATIVE
Leukocytes,Ua: NEGATIVE
Nitrite: NEGATIVE
Protein, ur: NEGATIVE
RBC / HPF: NONE SEEN /HPF (ref 0–2)
Specific Gravity, Urine: 1.02 (ref 1.001–1.035)
pH: 6 (ref 5.0–8.0)

## 2022-06-25 ENCOUNTER — Ambulatory Visit: Payer: Managed Care, Other (non HMO) | Admitting: Physical Therapy

## 2022-07-15 ENCOUNTER — Ambulatory Visit: Payer: Managed Care, Other (non HMO) | Admitting: Physical Therapy

## 2022-08-05 ENCOUNTER — Ambulatory Visit (INDEPENDENT_AMBULATORY_CARE_PROVIDER_SITE_OTHER): Payer: Managed Care, Other (non HMO) | Admitting: Physician Assistant

## 2022-08-05 ENCOUNTER — Encounter: Payer: Self-pay | Admitting: Physician Assistant

## 2022-08-05 ENCOUNTER — Other Ambulatory Visit (INDEPENDENT_AMBULATORY_CARE_PROVIDER_SITE_OTHER): Payer: Managed Care, Other (non HMO)

## 2022-08-05 ENCOUNTER — Other Ambulatory Visit: Payer: Self-pay | Admitting: *Deleted

## 2022-08-05 VITALS — BP 140/90 | HR 79 | Temp 97.5°F | Ht 69.0 in | Wt 277.0 lb

## 2022-08-05 DIAGNOSIS — Z23 Encounter for immunization: Secondary | ICD-10-CM | POA: Diagnosis not present

## 2022-08-05 DIAGNOSIS — E785 Hyperlipidemia, unspecified: Secondary | ICD-10-CM

## 2022-08-05 DIAGNOSIS — F419 Anxiety disorder, unspecified: Secondary | ICD-10-CM | POA: Diagnosis not present

## 2022-08-05 DIAGNOSIS — Z0000 Encounter for general adult medical examination without abnormal findings: Secondary | ICD-10-CM

## 2022-08-05 DIAGNOSIS — E669 Obesity, unspecified: Secondary | ICD-10-CM | POA: Diagnosis not present

## 2022-08-05 DIAGNOSIS — R030 Elevated blood-pressure reading, without diagnosis of hypertension: Secondary | ICD-10-CM

## 2022-08-05 LAB — HEMOGLOBIN A1C: Hgb A1c MFr Bld: 5.8 % (ref 4.6–6.5)

## 2022-08-05 LAB — CBC WITH DIFFERENTIAL/PLATELET
Basophils Absolute: 0 10*3/uL (ref 0.0–0.1)
Basophils Relative: 0.9 % (ref 0.0–3.0)
Eosinophils Absolute: 0.1 10*3/uL (ref 0.0–0.7)
Eosinophils Relative: 2.5 % (ref 0.0–5.0)
HCT: 38.8 % (ref 36.0–46.0)
Hemoglobin: 13 g/dL (ref 12.0–15.0)
Lymphocytes Relative: 26.8 % (ref 12.0–46.0)
Lymphs Abs: 1.3 10*3/uL (ref 0.7–4.0)
MCHC: 33.5 g/dL (ref 30.0–36.0)
MCV: 93.2 fl (ref 78.0–100.0)
Monocytes Absolute: 0.5 10*3/uL (ref 0.1–1.0)
Monocytes Relative: 10 % (ref 3.0–12.0)
Neutro Abs: 3 10*3/uL (ref 1.4–7.7)
Neutrophils Relative %: 59.8 % (ref 43.0–77.0)
Platelets: 256 10*3/uL (ref 150.0–400.0)
RBC: 4.16 Mil/uL (ref 3.87–5.11)
RDW: 14.4 % (ref 11.5–15.5)
WBC: 5 10*3/uL (ref 4.0–10.5)

## 2022-08-05 LAB — LIPID PANEL
Cholesterol: 258 mg/dL — ABNORMAL HIGH (ref 0–200)
HDL: 72.4 mg/dL (ref >39.00)
LDL Cholesterol: 165 mg/dL — ABNORMAL HIGH (ref 0–99)
NonHDL: 185.28
Total CHOL/HDL Ratio: 4
Triglycerides: 99 mg/dL (ref 0.0–149.0)
VLDL: 19.8 mg/dL (ref 0.0–40.0)

## 2022-08-05 LAB — COMPREHENSIVE METABOLIC PANEL
ALT: 15 U/L (ref 0–35)
AST: 19 U/L (ref 0–37)
Albumin: 4.1 g/dL (ref 3.5–5.2)
Alkaline Phosphatase: 97 U/L (ref 39–117)
BUN: 13 mg/dL (ref 6–23)
CO2: 28 mEq/L (ref 19–32)
Calcium: 9.5 mg/dL (ref 8.4–10.5)
Chloride: 104 mEq/L (ref 96–112)
Creatinine, Ser: 0.84 mg/dL (ref 0.40–1.20)
GFR: 74.69 mL/min (ref >60.00)
Glucose, Bld: 94 mg/dL (ref 70–99)
Potassium: 4.5 mEq/L (ref 3.5–5.1)
Sodium: 139 mEq/L (ref 135–145)
Total Bilirubin: 0.5 mg/dL (ref 0.2–1.2)
Total Protein: 6.8 g/dL (ref 6.0–8.3)

## 2022-08-05 MED ORDER — CITALOPRAM HYDROBROMIDE 20 MG PO TABS: 20.0000 mg | ORAL_TABLET | Freq: Every day | ORAL | 3 refills | Status: DC

## 2022-08-05 NOTE — Progress Notes (Signed)
Subjective:    Susan Bender is a 61 y.o. female and is here for a comprehensive physical exam.  HPI  Health Maintenance Due  Topic Date Due   COVID-19 Vaccine (5 - Pfizer series) 06/08/2021   INFLUENZA VACCINE  07/01/2022   MAMMOGRAM  07/17/2022    Acute Concerns: NOne  Chronic Issues: Anxiety -- remains controlled on celexa 20 mg daily. Denies SI/HI. HLD -- currently uncontrolled; declined statin use in the past. Due for recheck today. Elevated blood pressure reading -- Currently taking no medication. At home blood pressure readings are: not regularly checked as they have been normal during her office visits. Patient denies chest pain, SOB, blurred vision, dizziness, unusual headaches, lower leg swelling.  Denies excessive caffeine intake, stimulant usage, excessive alcohol intake, or increase in salt consumption.  BP Readings from Last 3 Encounters:  08/05/22 (!) 140/90  06/06/22 120/80  03/05/22 128/78    Health Maintenance: Immunizations -- UTD PAP -- UTD Mammogram -- needs to schedule Diet -- working on healthy eating Sleep habits -- doing much better after her hip surgery Exercise -- working on walking more with recent hip surgery Current Weight -- Weight: 277 lb (125.6 kg)  Weight History: Wt Readings from Last 10 Encounters:  08/05/22 277 lb (125.6 kg)  06/06/22 271 lb (122.9 kg)  03/05/22 266 lb (120.7 kg)  08/02/21 265 lb 8 oz (120.4 kg)  02/25/21 286 lb 6.4 oz (129.9 kg)  07/31/20 275 lb 8 oz (125 kg)  03/18/20 265 lb (120.2 kg)  01/23/20 265 lb (120.2 kg)  01/25/19 267 lb 6.4 oz (121.3 kg)  12/14/18 262 lb (118.8 kg)   Body mass index is 40.91 kg/m. Mood -- overall stable  No LMP recorded. Patient is postmenopausal. Period characteristics -- no bleeding    reports current alcohol use.  Tobacco Use: Low Risk  (08/05/2022)   Patient History    Smoking Tobacco Use: Never    Smokeless Tobacco Use: Never    Passive Exposure: Not on file    Not UTD with dentist  Goes to the eye doctor regularly     08/05/2022    7:53 AM  Depression screen PHQ 2/9  Decreased Interest 0  Down, Depressed, Hopeless 0  PHQ - 2 Score 0     Other providers/specialists: Patient Care Team: Jarold Motto, [place] as PCP - General (Physician Assistant) Jodelle Red, MD as PCP - Cardiology (Cardiology)   PMHx, SurgHx, SocialHx, Medications, and Allergies were reviewed in the Visit Navigator and updated as appropriate.   Past Medical History:  Diagnosis Date   Anxiety    Arthritis    Ebstein anomaly    diagnosed in age 24's   Endometriosis    PFO (patent foramen ovale)      Past Surgical History:  Procedure Laterality Date   BREAST BIOPSY Right    BREAST EXCISIONAL BIOPSY Left    x2   JOINT REPLACEMENT     LASER LAPAROSCOPY     WRIST SURGERY Right 2019     Family History  Problem Relation Age of Onset   Arthritis Mother    Alzheimer's disease Mother    Diabetes Father    Heart attack Father    Heart disease Father    Hypertension Father    Parkinson's disease Father    COPD Sister    Diabetes Sister    Hypertension Sister    Heart attack Paternal Grandfather    Heart disease Paternal Grandfather  Asthma Sister    Breast cancer Paternal Aunt     Social History   Tobacco Use   Smoking status: Never   Smokeless tobacco: Never  Vaping Use   Vaping Use: Never used  Substance Use Topics   Alcohol use: Yes    Comment: occassional glass of wine   Drug use: Never    Review of Systems:   Review of Systems  Constitutional:  Negative for chills, fever, malaise/fatigue and weight loss.  HENT:  Negative for hearing loss, sinus pain and sore throat.   Respiratory:  Negative for cough and hemoptysis.   Cardiovascular:  Negative for chest pain, palpitations, leg swelling and PND.  Gastrointestinal:  Negative for abdominal pain, constipation, diarrhea, heartburn, nausea and vomiting.  Genitourinary:   Negative for dysuria, frequency and urgency.  Musculoskeletal:  Negative for back pain, myalgias and neck pain.  Skin:  Negative for itching and rash.  Neurological:  Negative for dizziness, tingling, seizures and headaches.  Endo/Heme/Allergies:  Negative for polydipsia.  Psychiatric/Behavioral:  Negative for depression. The patient is not nervous/anxious.       Objective:   BP (!) 142/90 (BP Location: Left Arm, Patient Position: Sitting, Cuff Size: Large)   Pulse 79   Temp (!) 97.5 F (36.4 C) (Temporal)   Ht 5\' 9"  (1.753 m)   Wt 277 lb (125.6 kg)   SpO2 97%   BMI 40.91 kg/m   General Appearance:    Alert, cooperative, no distress, appears stated age  Head:    Normocephalic, without obvious abnormality, atraumatic  Eyes:    PERRL, conjunctiva/corneas clear, EOM's intact, fundi    benign, both eyes  Ears:    Normal TM's and external ear canals, both ears  Nose:   Nares normal, septum midline, mucosa normal, no drainage    or sinus tenderness  Throat:   Lips, mucosa, and tongue normal; teeth and gums normal  Neck:   Supple, symmetrical, trachea midline, no adenopathy;    thyroid:  no enlargement/tenderness/nodules; no carotid   bruit or JVD  Back:     Symmetric, no curvature, ROM normal, no CVA tenderness  Lungs:     Clear to auscultation bilaterally, respirations unlabored  Chest Wall:    No tenderness or deformity   Heart:    Regular rate and rhythm, S1 and S2 normal, no murmur, rub   or gallop  Breast Exam:    Deferred  Abdomen:     Soft, non-tender, bowel sounds active all four quadrants,    no masses, no organomegaly  Genitalia:    Deferred  Rectal:    Deferred  Extremities:   Extremities normal, atraumatic, no cyanosis or edema  Pulses:   2+ and symmetric all extremities  Skin:   Skin color, texture, turgor normal, no rashes or lesions  Lymph nodes:   Cervical, supraclavicular, and axillary nodes normal  Neurologic:   CNII-XII intact, normal strength, sensation and  reflexes    throughout    Assessment/Plan:   Routine physical examination Today patient counseled on age appropriate routine health concerns for screening and prevention, each reviewed and up to date or declined. Immunizations reviewed and up to date or declined. Labs ordered and reviewed. Risk factors for depression reviewed and negative. Hearing function and visual acuity are intact. ADLs screened and addressed as needed. Functional ability and level of safety reviewed and appropriate. Education, counseling and referrals performed based on assessed risks today. Patient provided with a copy of personalized plan for preventive  services.  Obesity, unspecified classification, unspecified obesity type, unspecified whether serious comorbidity present Continue efforts at healthy lifestyle  Hyperlipidemia, unspecified hyperlipidemia type Update lipid panel and make recommendations accordingly  Anxiety No red flags Controlled on celexa 20 mg daily Follow-up in 1 year, sooner if concerns  Elevated blood pressure reading Above goal No evidence of end organ damage Will have patient monitor BP at home, if consistently >140/90, I've asked her to let us know  Patient Counseling:   [x]     Nutrition: Stressed importance of moderation in sodium/caffeine intake, saturated fat and cholesterol, caloric balance, sufficient intake of fresh fruits, vegetables, fiber, calcium, iron, and 1 mg of folate supplement per day (for females capable of pregnancy).   [x]      Stressed the importance of regular exercise.    [x]     Substance Abuse: Discussed cessation/primary prevention of tobacco, alcohol, or other drug use; driving or other dangerous activities under the influence; availability of treatment for abuse.    [x]      Injury prevention: Discussed safety belts, safety helmets, smoke detector, smoking near bedding or upholstery.    [x]      Sexuality: Discussed sexually transmitted diseases, partner  selection, use of condoms, avoidance of unintended pregnancy  and contraceptive alternatives.    [x]     Dental health: Discussed importance of regular tooth brushing, flossing, and dental visits.   [x]      Health maintenance and immunizations reviewed. Please refer to Health maintenance section.   , PA-C [facility]

## 2022-08-05 NOTE — Addendum Note (Signed)
Addended by: Haynes Bast on: 08/05/2022 09:05 AM   Modules accepted: Orders

## 2022-08-05 NOTE — Patient Instructions (Signed)
It was great to see you! ? ?Please go to the lab for blood work.  ? ?Our office will call you with your results unless you have chosen to receive results via MyChart. ? ?If your blood work is normal we will follow-up each year for physicals and as scheduled for chronic medical problems. ? ?If anything is abnormal we will treat accordingly and get you in for a follow-up. ? ?Take care, ? ?Josselyn Harkins ?  ? ? ?

## 2022-08-06 ENCOUNTER — Other Ambulatory Visit: Payer: Self-pay | Admitting: Physician Assistant

## 2022-08-06 DIAGNOSIS — E785 Hyperlipidemia, unspecified: Secondary | ICD-10-CM

## 2022-08-11 ENCOUNTER — Other Ambulatory Visit (HOSPITAL_BASED_OUTPATIENT_CLINIC_OR_DEPARTMENT_OTHER): Payer: Self-pay | Admitting: Physician Assistant

## 2022-08-11 DIAGNOSIS — Z1231 Encounter for screening mammogram for malignant neoplasm of breast: Secondary | ICD-10-CM

## 2022-08-14 ENCOUNTER — Ambulatory Visit (INDEPENDENT_AMBULATORY_CARE_PROVIDER_SITE_OTHER): Payer: Managed Care, Other (non HMO)

## 2022-08-14 DIAGNOSIS — Z1231 Encounter for screening mammogram for malignant neoplasm of breast: Secondary | ICD-10-CM | POA: Diagnosis not present

## 2022-09-03 ENCOUNTER — Ambulatory Visit (HOSPITAL_BASED_OUTPATIENT_CLINIC_OR_DEPARTMENT_OTHER): Payer: Managed Care, Other (non HMO)

## 2022-09-17 ENCOUNTER — Ambulatory Visit (HOSPITAL_BASED_OUTPATIENT_CLINIC_OR_DEPARTMENT_OTHER)
Admission: RE | Admit: 2022-09-17 | Discharge: 2022-09-17 | Disposition: A | Discharge status: Home / Self Care | Payer: Managed Care, Other (non HMO) | Source: Ambulatory Visit | Attending: Physician Assistant | Admitting: Physician Assistant

## 2022-09-17 DIAGNOSIS — E785 Hyperlipidemia, unspecified: Secondary | ICD-10-CM | POA: Insufficient documentation

## 2022-11-13 ENCOUNTER — Encounter: Payer: Self-pay | Admitting: Physician Assistant

## 2022-11-18 ENCOUNTER — Ambulatory Visit: Payer: Managed Care, Other (non HMO) | Admitting: Physician Assistant

## 2022-11-18 ENCOUNTER — Encounter: Payer: Self-pay | Admitting: Physician Assistant

## 2022-11-18 VITALS — BP 136/80 | HR 88 | Temp 97.5°F | Ht 69.0 in | Wt 280.0 lb

## 2022-11-18 DIAGNOSIS — R531 Weakness: Secondary | ICD-10-CM

## 2022-11-18 MED ORDER — LORAZEPAM 0.5 MG PO TABS: ORAL_TABLET | ORAL | 0 refills | Status: DC

## 2022-11-18 NOTE — Patient Instructions (Addendum)
It was great to see you!  We will order stat imaging of your brain and spine.  Please stay by your phone and we will call you.  If new/worsening symptoms in the meantime --> please go to the ER!!!

## 2022-11-18 NOTE — Progress Notes (Signed)
Susan Bender is a 61 y.o. female here for a new problem.  History of Present Illness:   Chief Complaint  Patient presents with   Numbness    Pt c/o humbleness left foot, worse since hip surgery 05/2022.   Extremity Weakness    Pt c/o weakness left arm x 6 months but worse past 6 months, arm shakes.    HPI  Weakness and Numbness Upper extremity weakness L>R. She reports that this has been present for quite some time but worse x6 months. 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 She spends a long time on her feet and lifting heavy trays. Able to function in surgery but has noticed a change in strength somewhat when using left hand. She has required assistance getting her surgery gown on for the last several months. Has difficulty putting on her seatbelt or washing her right arm using the left. Has had a limp with her gait since prior to her hip surgery.  Reports accompanying incontinence prior to hip surgery which resolved after surgery. However, has had some urinary urgency requiring her to wear a pad at nights. Accompanying left hand tremor, mild. Tremor primarily occurs when holding her left arm up.  Denies any neck pain. Has accompanying low back pain intermittently. Denies any new vision changes. Denies any falls. Denies any injuries.  Patient's father had Parkinson's. Patient's mother had Alzheimer's  Denies any PMHx of neurological disorders. She has a maternal cousin who has MS with minimal side effects.  Bilateral feet numbness occurring as well L>R. Worsened after hip surgery in June 2023. Accompanying left leg weakness, mild.  Past Medical History:  Diagnosis Date   Anxiety    Arthritis    Ebstein anomaly    diagnosed in age 55's   Endometriosis    PFO (patent foramen ovale)      Social History   Tobacco Use   Smoking status: Never   Smokeless tobacco: Never  Vaping Use   Vaping Use: Never used  Substance Use Topics   Alcohol use:  Yes    Comment: occassional glass of wine   Drug use: Never    Past Surgical History:  Procedure Laterality Date   BREAST BIOPSY Right    BREAST EXCISIONAL BIOPSY Left    x2   JOINT REPLACEMENT     LASER LAPAROSCOPY     WRIST SURGERY Right 2019    Family History  Problem Relation Age of Onset   Arthritis Mother    Alzheimer's disease Mother    Diabetes Father    Heart attack Father    Heart disease Father    Hypertension Father    Parkinson's disease Father    COPD Sister    Diabetes Sister    Hypertension Sister    Asthma Sister    Heart attack Paternal Grandfather    Heart disease Paternal Grandfather    Breast cancer Paternal Aunt    Multiple sclerosis Cousin     Allergies  Allergen Reactions   Sulfa Antibiotics Hives and Rash    Current Medications:   Current Outpatient Medications:    cimetidine (TAGAMET) 200 MG tablet, Take 200 mg by mouth daily as needed., Disp: , Rfl:    citalopram (CELEXA) 20 MG tablet, Take 1 tablet (20 mg total) by mouth daily., Disp: 90 tablet, Rfl: 3   meloxicam (MOBIC) 7.5 MG tablet, Take 7.5 mg by mouth daily., Disp: , Rfl:    Multiple Vitamins-Minerals (ONE-A-DAY WOMENS  VITACRAVES) CHEW, Chew 1 each by mouth daily., Disp: , Rfl:    Review of Systems:   Review of Systems  Eyes:  Negative for blurred vision and double vision.  Respiratory:  Negative for shortness of breath.   Cardiovascular:  Negative for chest pain, palpitations and leg swelling.  Genitourinary:  Positive for urgency.  Musculoskeletal:  Positive for back pain. Negative for falls and neck pain.  Neurological:  Positive for tremors (left hand), sensory change (numbness left foot) and focal weakness (LUE x6 months).    Vitals:   Vitals:   11/18/22 1057  BP: 136/80  Pulse: 88  Temp: (!) 97.5 F (36.4 C)  TempSrc: Temporal  SpO2: 99%  Weight: 280 lb (127 kg)  Height: 5\' 9"  (1.753 m)     Body mass index is 41.35 kg/m.  Physical Exam:   Physical  Exam Vitals and nursing note reviewed.  Constitutional:      General: She is not in acute distress.    Appearance: She is well-developed. She is not ill-appearing or toxic-appearing.  Cardiovascular:     Rate and Rhythm: Normal rate and regular rhythm.     Pulses: Normal pulses.     Heart sounds: Normal heart sounds, S1 normal and S2 normal.  Pulmonary:     Effort: Pulmonary effort is normal.     Breath sounds: Normal breath sounds.  Skin:    General: Skin is warm and dry.  Neurological:     Mental Status: She is alert.     GCS: GCS eye subscore is 4. GCS verbal subscore is 5. GCS motor subscore is 6.     Comments: Strength 4/5 on LUE and LLE Strength 5/5 on RUE and RLE  Slight tremor noted in left hand with extension   Psychiatric:        Speech: Speech normal.        Behavior: Behavior normal. Behavior is cooperative.     Assessment and Plan:   Left-sided weakness Unclear etiology Since this has been going on for several months, do not feel as though patient warrants ER evaluation Will obtain stat MR w/ and w/o contrast of brain and total spine to evaluate for MS, mass, infarct, lesion, or compression Referral to appropriate specialists once images have resulted Low threshold to go to ER if any new/worsening sx Consulted with regarding case who was in agreement of plan to order images  I,Alexis Herring,acting as a Jac[employer]iline Doe for Neurosurgeon, PA.,have documented all relevant documentation on the behalf of [employer], PA,as directed by  Jarold Motto, PA while in the presence of Jarold Motto, Jarold Motto.  I, [place], Jarold Motto, have reviewed all documentation for this visit. The documentation on 11/18/22 for the exam, diagnosis, procedures, and orders are all accurate and complete.  11/20/22, PA-C

## 2022-11-25 ENCOUNTER — Encounter: Payer: Self-pay | Admitting: Physician Assistant

## 2022-11-25 ENCOUNTER — Other Ambulatory Visit: Payer: Self-pay | Admitting: Physician Assistant

## 2022-11-25 MED ORDER — LORAZEPAM 0.5 MG PO TABS: ORAL_TABLET | ORAL | 0 refills | Status: DC

## 2022-12-10 ENCOUNTER — Other Ambulatory Visit: Payer: Self-pay | Admitting: Physician Assistant

## 2022-12-10 DIAGNOSIS — R531 Weakness: Secondary | ICD-10-CM

## 2022-12-10 NOTE — Telephone Encounter (Signed)
MRI results received from [facility] -- available in Care Everywhere.  I personally called patient and reviewed results.  Plan is: URGENT neurology referral for "several small foci of T2 hyperintensity in the cerebral white matter, mostly in the frontal and parietal lobes." I have instructed patient to let me know if she doesn't hear from neuro within 1 week. If any NEW/WORSENING sx in the meantime, needs to go straight to the ER Routine physical therapy referral recommended for spine findings of degenerative disc and spinal stenosis. She is agreeable to this but would like to pursue neurology referral first.  Inda Coke PA-C

## 2022-12-15 ENCOUNTER — Encounter: Payer: Self-pay | Admitting: Physician Assistant

## 2022-12-15 ENCOUNTER — Encounter: Payer: Self-pay | Admitting: Diagnostic Neuroimaging

## 2022-12-15 ENCOUNTER — Ambulatory Visit: Payer: Managed Care, Other (non HMO) | Admitting: Diagnostic Neuroimaging

## 2022-12-15 VITALS — BP 194/104 | HR 93 | Ht 69.0 in | Wt 281.0 lb

## 2022-12-15 DIAGNOSIS — G20A1 Parkinson's disease without dyskinesia, without mention of fluctuations: Secondary | ICD-10-CM

## 2022-12-15 MED ORDER — CARBIDOPA-LEVODOPA 25-100 MG PO TABS: 1.0000 | ORAL_TABLET | Freq: Three times a day (TID) | ORAL | 6 refills | Status: DC

## 2022-12-15 NOTE — Patient Instructions (Signed)
  parkinsonism - start carbidopa / levodopa (25/100) half tab three times a day with meals x 1-2 weeks; then 1 tab three times a day with meals - may consider rasagiline, amantadine in future - encouraged to optimize nutrition, exercises, sleep and stress mgmt

## 2022-12-15 NOTE — Progress Notes (Signed)
[facility]  PATIENT: Susan Bender DOB: Mar 06, 1961  REFERRING CLINICIAN: Inda Coke, PA HISTORY FROM: patient  REASON FOR VISIT: new consult   HISTORICAL  CHIEF COMPLAINT:  Chief Complaint  Patient presents with   Extremity Weakness    Rm 6 alone Pt is well, has noticed weakness mostly on L arm since early last yr but was also having L hip issues then and had a replacement.     HISTORY OF PRESENT ILLNESS:   62 year old female here for evaluation of left-sided weakness.  Since April 2023 has noticed issues with decreased strength, speed, dexterity and coronation on her left arm and left leg.  June 2023 had left hip replacement.  Symptoms gradual worsening over time.  Symptoms worse when she is tired.  Having some issues with her balance and coordination.  Father had Parkinson disease.  Mother had Alzheimer disease.  Patient concerned about underlying neurologic issues.  She is an OR surgical tech, currently working.    REVIEW OF SYSTEMS: Full 14 system review of systems performed and negative with exception of: as per HPI.  ALLERGIES: Allergies  Allergen Reactions   Sulfa Antibiotics Hives and Rash    HOME MEDICATIONS: Outpatient Medications Prior to Visit  Medication Sig Dispense Refill   cimetidine (TAGAMET) 200 MG tablet Take 200 mg by mouth daily as needed.     citalopram (CELEXA) 20 MG tablet Take 20 mg by mouth daily.     meloxicam (MOBIC) 7.5 MG tablet Take 7.5 mg by mouth daily.     Multiple Vitamins-Minerals (ONE-A-DAY WOMENS VITACRAVES) CHEW Chew 1 each by mouth daily.     citalopram (CELEXA) 20 MG tablet Take 1 tablet (20 mg total) by mouth daily. (Patient not taking: Reported on 12/15/2022) 90 tablet 3   LORazepam (ATIVAN) 0.5 MG tablet Take 1 tablet 30 min before procedure and may take another tablet at start of procedure (Patient not taking: Reported on 12/15/2022) 4 tablet 0   No facility-administered medications prior to visit.     PAST MEDICAL HISTORY: Past Medical History:  Diagnosis Date   Anxiety    Arthritis    Ebstein anomaly    diagnosed in age 62's   Endometriosis    PFO (patent foramen ovale)     PAST SURGICAL HISTORY: Past Surgical History:  Procedure Laterality Date   BREAST BIOPSY Right    BREAST EXCISIONAL BIOPSY Left    x2   JOINT REPLACEMENT     LASER LAPAROSCOPY     REPLACEMENT TOTAL HIP W/  RESURFACING IMPLANTS Left 05/2022   WRIST SURGERY Right 2019    FAMILY HISTORY: Family History  Problem Relation Age of Onset   Arthritis Mother    Alzheimer's disease Mother    Diabetes Father    Heart attack Father    Heart disease Father    Hypertension Father    Parkinson's disease Father    COPD Sister    Diabetes Sister    Hypertension Sister    Asthma Sister    Heart attack Paternal Grandfather    Heart disease Paternal Grandfather    Breast cancer Paternal Aunt    Multiple sclerosis Cousin     SOCIAL HISTORY: Social History   Socioeconomic History   Marital status: Single    Spouse name: Not on file   Number of children: Not on file   Years of education: Not on file   Highest education level: Not on file  Occupational History   Not  on file  Tobacco Use   Smoking status: Never   Smokeless tobacco: Never  Vaping Use   Vaping Use: Never used  Substance and Sexual Activity   Alcohol use: Yes    Comment: occassional glass of wine   Drug use: Never   Sexual activity: Not Currently  Other Topics Concern   Not on file  Social History Narrative   Surgical tech at [employer] to [place] to be closer to mother and sister   No children   Social Determinants of Health   Financial Resource Strain: Not on file  Food Insecurity: Not on file  Transportation Needs: Not on file  Physical Activity: Not on file  Stress: Not on file  Social Connections: Not on file  Intimate Partner Violence: Not on file     PHYSICAL EXAM  GENERAL EXAM/CONSTITUTIONAL: Vitals:  Vitals:    12/15/22 0933  BP: (!) 211/119  Pulse: (!) 109  Weight: 281 lb (127.5 kg)  Height: 5\' 9"  (1.753 m)   Body mass index is 41.5 kg/m. Wt Readings from Last 3 Encounters:  12/15/22 281 lb (127.5 kg)  11/18/22 280 lb (127 kg)  08/05/22 277 lb (125.6 kg)   Patient is in no distress; well developed, nourished and groomed; neck is supple  CARDIOVASCULAR: Examination of carotid arteries is normal; no carotid bruits Regular rate and rhythm, no murmurs Examination of peripheral vascular system by observation and palpation is normal  EYES: Ophthalmoscopic exam of optic discs and posterior segments is normal; no papilledema or hemorrhages No results found.  MUSCULOSKELETAL: Gait, strength, tone, movements noted in Neurologic exam below  NEUROLOGIC: MENTAL STATUS:      No data to display         awake, alert, oriented to person, place and time recent and remote memory intact normal attention and concentration language fluent, comprehension intact, naming intact fund of knowledge appropriate  CRANIAL NERVE:  2nd - no papilledema on fundoscopic exam 2nd, 3rd, 4th, 6th - pupils equal and reactive to light, visual fields full to confrontation, extraocular muscles intact, no nystagmus 5th - facial sensation symmetric 7th - facial strength symmetric 8th - hearing intact 9th - palate elevates symmetrically, uvula midline 11th - shoulder shrug symmetric 12th - tongue protrusion midline  MOTOR:  INCREASED TONE IN LUE (COGWHEELING); BRADYKINESIA IN LUE > RUE normal bulk and tone, full strength in the BUE, BLE  SENSORY:  normal and symmetric to light touch, temperature, vibration  COORDINATION:  finger-nose-finger, fine finger movements SLOW ON LEFT  REFLEXES:  deep tendon reflexes 1+ and symmetric  GAIT/STATION:  narrow based gait; SHORT STEPS; LEFT ARM FLEXED     DIAGNOSTIC DATA (LABS, IMAGING, TESTING) - I reviewed patient records, labs, notes, testing and imaging  myself where available.  Lab Results  Component Value Date   WBC 5.0 08/05/2022   HGB 13.0 08/05/2022   HCT 38.8 08/05/2022   MCV 93.2 08/05/2022   PLT 256.0 08/05/2022      Component Value Date/Time   NA 139 08/05/2022 0905   NA 143 10/01/2017 0000   K 4.5 08/05/2022 0905   CL 104 08/05/2022 0905   CO2 28 08/05/2022 0905   GLUCOSE 94 08/05/2022 0905   BUN 13 08/05/2022 0905   BUN 12 10/01/2017 0000   CREATININE 0.84 08/05/2022 0905   CREATININE 0.89 07/31/2020 1116   CALCIUM 9.5 08/05/2022 0905   PROT 6.8 08/05/2022 0905   ALBUMIN 4.1 08/05/2022 0905   AST 19  08/05/2022 0905   ALT 15 08/05/2022 0905   ALKPHOS 97 08/05/2022 0905   BILITOT 0.5 08/05/2022 0905   Lab Results  Component Value Date   CHOL 258 (H) 08/05/2022   HDL 72.40 08/05/2022   LDLCALC 165 (H) 08/05/2022   TRIG 99.0 08/05/2022   CHOLHDL 4 08/05/2022   Lab Results  Component Value Date   HGBA1C 5.8 08/05/2022   No results found for: "VITAMINB12" Lab Results  Component Value Date   TSH 1.28 10/01/2017    12/08/22 MRI brain - There are several small  foci of T2 hyperintensity in the cerebral white matter, mostly in the frontal and parietal lobes. The pattern is nonspecific, but not suggestive of multiple sclerosis.  - There is no acute infarct, mass lesion or other significant abnormality. No cause for left-sided weakness identified.   12/08/22 MRI cervical -Moderate degenerative disc disease at C4-5, C5-6 and C6-7.  -No spinal stenosis. Normal cervical spinal cord.   12/08/22 MRI thoracic -Degenerative changes as described above, most pronounced at T10-T11 and T11-T12 resulting in mild spinal canal and neuroforaminal stenosis.   12/08/22 MRI lumbar spine -Disc desiccation, facet arthrosis, levoscoliosis and a grade 1 anterolisthesis at L4-L5 resulting in mild spinal canal and neuroforaminal stenosis, as described above.    ASSESSMENT AND PLAN  62 y.o. year old female here with:   Dx:  1.  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    Dispense:  90 tablet    Refill:  6   Return in about 4 months (around 04/15/2023).    Penni Bombard, MD 8/46/9629, 52:84 AM Certified in Neurology, Neurophysiology and Neuroimaging  [facility] [address] [place], [place] [address] [phone]

## 2023-01-19 ENCOUNTER — Encounter: Payer: Self-pay | Admitting: Diagnostic Neuroimaging

## 2023-04-09 NOTE — Progress Notes (Signed)
Cardiology Office Note:    Date:  04/10/2023   ID:  Susan Bender, DOB 1961/02/28, MRN [id number]  PCP:  Jarold Motto, PA  Cardiologist:  Jodelle Red, MD PhD  Referring MD: Jarold Motto, [place]   CC: follow up  History of Present Illness:    Susan Bender is a 62 y.o. female with a hx of Ebstein's anomaly, Parkinson's disease, who is seen for follow up. I initially saw her 01/25/19 as a new consult at the request of Jarold Motto, [place] for the evaluation and management of Ebstein's.  Cardiac history: Diagnosed in her 10s, no prior cardiac history. 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 Checked on an ECG, saw fast rhythm, went to see provider. Had holter monitor placed and was placed on medication by an NP. (per Dr. Orie Rout notes, Holter showed isolated PACs and PVCs, was placed on beta blocker). Felt terrible on medication, referred to Dr. Griffith Citron in [place], [place]. Had normal stress test, then had echo that showed Ebstein's. Was referred to Dr. Deatra James at the [facility], has one visit there. Was followed annually by Dr. Griffith Citron with echo every 3-5 years.   At her last visit she was struggling with hip pain that was limiting her exercise. Also complained of intermittent "pitter patter" palpitations. From July - November 2022, she had lost 45 lbs with trying the Optavia diet, but regained some of her weight after Christmas. We discussed Ebstein's, signs of right heart failure to watch for. No evidence of this at that appointment. Also discussed GLP1RA which she would consider.   She had a coronary CT 08/2022 revealing a coronary calcium score of 0. Over-read interpretation per radiology notable for trace pericardial effusion.  Today, we reviewed her recent workup with neurology. She has been struggling with left-sided weakness, worsening since her left hip replacement in 05/2022. She was recently  diagnosed with Parkinson's disease. Her father also had Parkinson's disease, and her mother had Alzheimer's. Lately she believes she has noticed some improvement in her symptoms with taking the carbidopa-levodopa. She still has significant left-sided weakness and balance issues.   From a cardiovascular perspective, she denies any new concerns or changing symptoms. She has some left foot swelling and numbness that she attributes to prior injuries. She does wear compression socks routinely.  For exercise, she is participating in yoga twice a week, both regular and chair yoga. Afterwards she usually feels better. Also she works out in the yard a little at a time. She drinks half-caffeinated coffee in the mornings, usually 1 cup but sometimes 2 cups.  She denies any chest pain, shortness of breath, lightheadedness, headaches, syncope, orthopnea, or PND.   Past Medical History:  Diagnosis Date   Anxiety    Arthritis    Ebstein anomaly    diagnosed in age 60's   Endometriosis    PFO (patent foramen ovale)     Past Surgical History:  Procedure Laterality Date   BREAST BIOPSY Right    BREAST EXCISIONAL BIOPSY Left    x2   JOINT REPLACEMENT     LASER LAPAROSCOPY     REPLACEMENT TOTAL HIP W/  RESURFACING IMPLANTS Left 05/2022   WRIST SURGERY Right 2019    Current Medications: Current Outpatient Medications on File Prior to Visit  Medication Sig   carbidopa-levodopa (SINEMET IR) 25-100 MG tablet Take 1 tablet by mouth 3 (three) times daily before  meals.   cimetidine (TAGAMET) 200 MG tablet Take 200 mg by mouth daily as needed.   citalopram (CELEXA) 20 MG tablet Take 20 mg by mouth daily.   meloxicam (MOBIC) 7.5 MG tablet Take 7.5 mg by mouth daily.   Multiple Vitamins-Minerals (ONE-A-DAY WOMENS VITACRAVES) CHEW Chew 1 each by mouth daily.   No current facility-administered medications on file prior to visit.     Allergies:   Sulfa antibiotics   Social History   Tobacco Use    Smoking status: Never   Smokeless tobacco: Never  Vaping Use   Vaping Use: Never used  Substance Use Topics   Alcohol use: Yes    Comment: occassional glass of wine   Drug use: Never    Family History: The patient's family history includes Alzheimer's disease in her mother; Arthritis in her mother; Asthma in her sister; Breast cancer in her paternal aunt; COPD in her sister; Diabetes in her father and sister; Heart attack in her father and paternal grandfather; Heart disease in her father and paternal grandfather; Hypertension in her father and sister; Multiple sclerosis in her cousin; Parkinson's disease in her father. Dad had MI and CABG in his late 68s.   ROS:   Please see the history of present illness.   (+) Left-sided weakness (+) Imbalance (+) Left foot edema, numbness Additional pertinent ROS negative except as documented.    EKGs/Labs/Other Studies Reviewed:    The following studies were reviewed today:  CT Cardiac Scoring  09/17/2022: IMPRESSION: Coronary calcium score of 0.  OVER-READ INTERPRETATION:  IMPRESSION:  Trace pericardial effusion.  Echo 01/21/21: 1. History of Epstein's anomaly. The TV is not well visualized but there  is mild TR. The RV and RA are normal size.   2. No evidence of PFO. If there are concerns for PFO, would recommend  bubble study.   3. Left ventricular ejection fraction, by estimation, is 55 to 60%. Left  ventricular ejection fraction by 3D volume is 56 %. The left ventricle has  normal function. The left ventricle has no regional wall motion  abnormalities. Left ventricular diastolic   parameters were normal.   4. Right ventricular systolic function is normal. The right ventricular  size is normal. There is normal pulmonary artery systolic pressure. The  estimated right ventricular systolic pressure is 28.8 mmHg.   5. A small pericardial effusion is present. The pericardial effusion is  circumferential. There is no evidence of  cardiac tamponade.   6. The mitral valve is grossly normal. Trivial mitral valve  regurgitation. No evidence of mitral stenosis.   7. The aortic valve is tricuspid. There is mild calcification of the  aortic valve. Aortic valve regurgitation is trivial. No aortic stenosis is  present.   8. The inferior vena cava is normal in size with greater than 50%  respiratory variability, suggesting right atrial pressure of 3 mmHg.   Echo from [facility] 2006: FINDINGS LEFT VENTRICLE -   EF = 55 +/- 5%.  The left ventricle appears  normal in size.  Left ventricular systolic function is normal. RIGHT VENTRICLE - The right ventricle is normal in size and systolic function. LEFT ATRIUM/PULMONARY VEINS - The left atrium is normal.  The left atrial cavity appears normal in size.  The left atrial cavity area is 19.0 cm during systole. RIGHT ATRIUM/IVC/SVC - The right atrium is normal. MITRAL VALVE - The mitral valve is normal. TRICUSPID VALVE -   There is 2+ tricuspid regurgitation.  Regurgitant velocity is  270 cm/s and estimated RV systolic pressure is 34 mm Hg. AORTIC VALVE/LVOT - This is a tricuspid aortic valve.  There is 1+ aortic regurgitation. PULMONIC VALVE - The pulmonic valve is normal. AORTA - The aorta is normal. PULMONARY ARTERIES - The pulmonary arteries are normal. IAS/IVS - The interatrial and interventricular septa are normal.  There is right to left flow through the interatrial septum PERICARDIUM - The pericardium is normal.  CONCLUSIONS 1. Normal LV size and function  2. Normal RV function 3. 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 Mild aortic regurgitation. 5. Positive saline microcavitation study but bubbles appear in the LV after 3  cardiac cycles, more suggestive of intrapulmonary shunt.  EKG:  EKG is personally reviewed.   04/10/2023:  NSR at 88 bpm 03/05/2022 :NSR at 86 bpm 02/25/2021: Normal sinus rhythm, no delta wave. Low anterior forces V1-V3.  Unchanged from prior.  Recent Labs: 08/05/2022: ALT 15; BUN 13; Creatinine, Ser 0.84; Hemoglobin 13.0; Platelets 256.0; Potassium 4.5; Sodium 139   Recent Lipid Panel    Component Value Date/Time   CHOL 258 (H) 08/05/2022 0905   TRIG 99.0 08/05/2022 0905   HDL 72.40 08/05/2022 0905   CHOLHDL 4 08/05/2022 0905   VLDL 19.8 08/05/2022 0905   LDLCALC 165 (H) 08/05/2022 0905   LDLCALC 152 (H) 07/31/2020 1116    Physical Exam:    VS:  BP 136/78 (BP Location: Right Arm, Patient Position: Sitting, Cuff Size: Large)   Pulse 88   Ht 5\' 9"  (1.753 m)   Wt 280 lb 4.8 oz (127.1 kg)   BMI 41.39 kg/m     Wt Readings from Last 3 Encounters:  04/10/23 280 lb 4.8 oz (127.1 kg)  12/15/22 281 lb (127.5 kg)  11/18/22 280 lb (127 kg)    GEN: Well nourished, well developed in no acute distress HEENT: Normal, moist mucous membranes NECK: No JVD CARDIAC: regular rhythm, normal S1 and S2, no rubs or gallops. 1/6 HS murmur. VASCULAR: Radial and DP pulses 2+ bilaterally. No carotid bruits RESPIRATORY:  Clear to auscultation without rales, wheezing or rhonchi  ABDOMEN: Soft, non-tender, non-distended MUSCULOSKELETAL:  Ambulates independently SKIN: Warm and dry, no edema NEUROLOGIC:  Alert and oriented x 3. No focal neuro deficits noted. PSYCHIATRIC:  Normal affect   ASSESSMENT:    1. Ebstein anomaly   2. Class 3 severe obesity due to excess calories without serious comorbidity with body mass index (BMI) of 40.0 to 44.9 in adult [facility])   3. Cardiac risk counseling   4. Counseling on health promotion and disease prevention     PLAN:    Ebstein's anomaly: mild variant. Has not limited her. -no clinical symptoms of heart failure -Prior echo in 2006 was normal RV function. Reports last echo ~2017, I do not have a copy of this -echo 2021, due 2026. See results above. -no history of cyanosis or paradoxical embolism -reported flow across IAS in 2006, though bubble study suggest intrapulmonary shunt.  Possible PFO. -no significant arrhythmia seen on prior monitor evaluation. No delta wave on ECG.  -discussed Ebstein's, signs of right heart failure to watch for. No evidence of this today  Class 3 obesity, BMI 41: working on lifestyle, trying to make changes to work schedule to make this more realistic for her. We have discussed GLP1RA  Primary prevention: -recommend heart healthy/Mediterranean diet, with whole grains, fruits, vegetable, fish, lean meats, nuts, and olive oil. Limit salt. -recommend moderate walking,  3-5 times/week for 30-50 minutes each session. Aim for at least 150 minutes.week. Goal should be pace of 3 miles/hours, or walking 1.5 miles in 30 minutes -recommend avoidance of tobacco products. Avoid excess alcohol. -ASCVD risk score: The 10-year ASCVD risk score (Arnett DK, et al., 2019) is: 4.6%   Values used to calculate the score:     Age: 36 years     Sex: Female     Is Non-Hispanic African American: No     Diabetic: No     Tobacco smoker: No     Systolic Blood Pressure: 136 mmHg     Is BP treated: No     HDL Cholesterol: 72.4 mg/dL     Total Cholesterol: 258 mg/dL   Plan for follow up: 1 year or sooner as needed.  I,Mathew Stumpf,acting as a Neurosurgeon for [employer], MD.,have documented all relevant documentation on the behalf of Jodelle Red, MD,as directed by  Jodelle Red, MD while in the presence of Jodelle Red, MD.   I, Jodelle Red, MD, have reviewed all documentation for this visit. The documentation on 04/10/23 for the exam, diagnosis, procedures, and orders are all accurate and complete.   Signed, Jodelle Red, MD PhD 04/10/2023     [facility]

## 2023-04-10 ENCOUNTER — Encounter (HOSPITAL_BASED_OUTPATIENT_CLINIC_OR_DEPARTMENT_OTHER): Payer: Self-pay | Admitting: Cardiology

## 2023-04-10 ENCOUNTER — Ambulatory Visit (HOSPITAL_BASED_OUTPATIENT_CLINIC_OR_DEPARTMENT_OTHER): Payer: Managed Care, Other (non HMO) | Admitting: Cardiology

## 2023-04-10 VITALS — BP 136/78 | HR 88 | Ht 69.0 in | Wt 280.3 lb

## 2023-04-10 DIAGNOSIS — Q225 Ebstein's anomaly: Secondary | ICD-10-CM | POA: Diagnosis not present

## 2023-04-10 DIAGNOSIS — Z7189 Other specified counseling: Secondary | ICD-10-CM

## 2023-04-10 DIAGNOSIS — Z6841 Body Mass Index (BMI) 40.0 and over, adult: Secondary | ICD-10-CM | POA: Diagnosis not present

## 2023-04-10 NOTE — Patient Instructions (Signed)
Medication Instructions:  Your physician recommends that you continue on your current medications as directed. Please refer to the Current Medication list given to you today.  *If you need a refill on your cardiac medications before your next appointment, please call your pharmacy*  Lab Work: NONE  Testing/Procedures: NONE  Follow-Up: At [facility], you and your health needs are our priority.  As part of our continuing mission to provide you with exceptional heart care, we have created designated Provider Care Teams.  These Care Teams include your primary Cardiologist (physician) and Advanced Practice Providers (APPs -  Physician Assistants and Nurse Practitioners) who all work together to provide you with the care you need, when you need it.  We recommend signing up for the patient portal called "MyChart".  Sign up information is provided on this After Visit Summary.  MyChart is used to connect with patients for Virtual Visits (Telemedicine).  Patients are able to view lab/test results, encounter notes, upcoming appointments, etc.  Non-urgent messages can be sent to your provider as well.   To learn more about what you can do with MyChart, go to [email or web address].    Your next appointment:   12 month(s)  The format for your next appointment:   In Person  Provider:   Bridgette Christopher, MD     

## 2023-04-21 ENCOUNTER — Ambulatory Visit: Payer: Managed Care, Other (non HMO) | Admitting: Diagnostic Neuroimaging

## 2023-04-21 ENCOUNTER — Encounter: Payer: Self-pay | Admitting: Diagnostic Neuroimaging

## 2023-04-21 VITALS — BP 142/89 | HR 81 | Ht 69.0 in | Wt 278.0 lb

## 2023-04-21 DIAGNOSIS — G20B1 Parkinson's disease with dyskinesia, without mention of fluctuations: Secondary | ICD-10-CM

## 2023-04-21 MED ORDER — CARBIDOPA-LEVODOPA 25-100 MG PO TABS: 1.0000 | ORAL_TABLET | Freq: Three times a day (TID) | ORAL | 4 refills | Status: DC

## 2023-04-21 NOTE — Progress Notes (Signed)
[facility]  PATIENT: Susan Bender DOB: July 24, 1961  REFERRING CLINICIAN: Jarold Motto, PA HISTORY FROM: patient  REASON FOR VISIT: follow up   HISTORICAL  CHIEF COMPLAINT:  Chief Complaint  Patient presents with   Follow-up    Rm 6 alone Pt is well and stable, no new concerns since last visit. Sinemet has  improved symptoms.     HISTORY OF PRESENT ILLNESS:   UPDATE (04/21/23, VRP): Since last visit, doing slightly better on carb/levo. No wearing off. Symptoms are stable over. 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   PRIOR HPI: 62 year old female here for evaluation of left-sided weakness.  Since April 2023 has noticed issues with decreased strength, speed, dexterity and coronation on her left arm and left leg.  June 2023 had left hip replacement.  Symptoms gradual worsening over time.  Symptoms worse when she is tired.  Having some issues with her balance and coordination.  Father had Parkinson disease.  Mother had Alzheimer disease.  Patient concerned about underlying neurologic issues.  She is an OR surgical tech, currently working.    REVIEW OF SYSTEMS: Full 14 system review of systems performed and negative with exception of: as per HPI.  ALLERGIES: Allergies  Allergen Reactions   Sulfa Antibiotics Hives and Rash    HOME MEDICATIONS: Outpatient Medications Prior to Visit  Medication Sig Dispense Refill   cimetidine (TAGAMET) 200 MG tablet Take 200 mg by mouth daily as needed.     citalopram (CELEXA) 20 MG tablet Take 20 mg by mouth daily.     meloxicam (MOBIC) 7.5 MG tablet Take 7.5 mg by mouth daily.     Multiple Vitamins-Minerals (ONE-A-DAY WOMENS VITACRAVES) CHEW Chew 1 each by mouth daily.     carbidopa-levodopa (SINEMET IR) 25-100 MG tablet Take 1 tablet by mouth 3 (three) times daily before meals. 90 tablet 6   No facility-administered medications prior to visit.    PAST MEDICAL HISTORY: Past Medical History:  Diagnosis Date    Anxiety    Arthritis    Ebstein anomaly    diagnosed in age 51's   Endometriosis    PFO (patent foramen ovale)     PAST SURGICAL HISTORY: Past Surgical History:  Procedure Laterality Date   BREAST BIOPSY Right    BREAST EXCISIONAL BIOPSY Left    x2   JOINT REPLACEMENT     LASER LAPAROSCOPY     REPLACEMENT TOTAL HIP W/  RESURFACING IMPLANTS Left 05/2022   WRIST SURGERY Right 2019    FAMILY HISTORY: Family History  Problem Relation Age of Onset   Arthritis Mother    Alzheimer's disease Mother    Diabetes Father    Heart attack Father    Heart disease Father    Hypertension Father    Parkinson's disease Father    COPD Sister    Diabetes Sister    Hypertension Sister    Asthma Sister    Heart attack Paternal Grandfather    Heart disease Paternal Grandfather    Breast cancer Paternal Aunt    Multiple sclerosis Cousin     SOCIAL HISTORY: Social History   Socioeconomic History   Marital status: Single    Spouse name: Not on file   Number of children: Not on file   Years of education: Not on file   Highest education level: Not on file  Occupational History   Not on file  Tobacco Use   Smoking status: Never   Smokeless tobacco: Never  Vaping  Use   Vaping Use: Never used  Substance and Sexual Activity   Alcohol use: Yes    Comment: occassional glass of wine   Drug use: Never   Sexual activity: Not Currently  Other Topics Concern   Not on file  Social History Narrative   Surgical tech at [employer] to [place] to be closer to mother and sister   No children   Social Determinants of Health   Financial Resource Strain: Not on file  Food Insecurity: Not on file  Transportation Needs: Not on file  Physical Activity: Not on file  Stress: Not on file  Social Connections: Not on file  Intimate Partner Violence: Not on file     PHYSICAL EXAM  GENERAL EXAM/CONSTITUTIONAL: Vitals:  Vitals:   04/21/23 1359  BP: (!) 142/89  Pulse: 81  Weight: 278 lb (126.1  kg)  Height: 5\' 9"  (1.753 m)   Body mass index is 41.05 kg/m. Wt Readings from Last 3 Encounters:  04/21/23 278 lb (126.1 kg)  04/10/23 280 lb 4.8 oz (127.1 kg)  12/15/22 281 lb (127.5 kg)   Patient is in no distress; well developed, nourished and groomed; neck is supple  CARDIOVASCULAR: Examination of carotid arteries is normal; no carotid bruits Regular rate and rhythm, no murmurs Examination of peripheral vascular system by observation and palpation is normal  EYES: Ophthalmoscopic exam of optic discs and posterior segments is normal; no papilledema or hemorrhages No results found.  MUSCULOSKELETAL: Gait, strength, tone, movements noted in Neurologic exam below  NEUROLOGIC: MENTAL STATUS:      No data to display         awake, alert, oriented to person, place and time recent and remote memory intact normal attention and concentration language fluent, comprehension intact, naming intact fund of knowledge appropriate  CRANIAL NERVE:  2nd - no papilledema on fundoscopic exam 2nd, 3rd, 4th, 6th - pupils equal and reactive to light, visual fields full to confrontation, extraocular muscles intact, no nystagmus 5th - facial sensation symmetric 7th - facial strength symmetric 8th - hearing intact 9th - palate elevates symmetrically, uvula midline 11th - shoulder shrug symmetric 12th - tongue protrusion midline  MOTOR:  MILD INCREASED TONE IN LUE (COGWHEELING); BRADYKINESIA IN LUE AND LLE normal bulk and tone, full strength in the BUE, BLE MILD DYSKINESIA IN LEFT FOOT / LEG  SENSORY:  normal and symmetric to light touch, temperature, vibration  COORDINATION:  finger-nose-finger, fine finger movements SLOW ON LEFT  REFLEXES:  deep tendon reflexes 1+ and symmetric  GAIT/STATION:  narrow based gait; SHORT STEPS; LEFT ARM FLEXED     DIAGNOSTIC DATA (LABS, IMAGING, TESTING) - I reviewed patient records, labs, notes, testing and imaging myself where  available.  Lab Results  Component Value Date   WBC 5.0 08/05/2022   HGB 13.0 08/05/2022   HCT 38.8 08/05/2022   MCV 93.2 08/05/2022   PLT 256.0 08/05/2022      Component Value Date/Time   NA 139 08/05/2022 0905   NA 143 10/01/2017 0000   K 4.5 08/05/2022 0905   CL 104 08/05/2022 0905   CO2 28 08/05/2022 0905   GLUCOSE 94 08/05/2022 0905   BUN 13 08/05/2022 0905   BUN 12 10/01/2017 0000   CREATININE 0.84 08/05/2022 0905   CREATININE 0.89 07/31/2020 1116   CALCIUM 9.5 08/05/2022 0905   PROT 6.8 08/05/2022 0905   ALBUMIN 4.1 08/05/2022 0905   AST 19 08/05/2022 0905   ALT 15 08/05/2022 0905  ALKPHOS 97 08/05/2022 0905   BILITOT 0.5 08/05/2022 0905   Lab Results  Component Value Date   CHOL 258 (H) 08/05/2022   HDL 72.40 08/05/2022   LDLCALC 165 (H) 08/05/2022   TRIG 99.0 08/05/2022   CHOLHDL 4 08/05/2022   Lab Results  Component Value Date   HGBA1C 5.8 08/05/2022   No results found for: "VITAMINB12" Lab Results  Component Value Date   TSH 1.28 10/01/2017    12/08/22 MRI brain - There are several small  foci of T2 hyperintensity in the cerebral white matter, mostly in the frontal and parietal lobes. The pattern is nonspecific, but not suggestive of multiple sclerosis.  - There is no acute infarct, mass lesion or other significant abnormality. No cause for left-sided weakness identified.   12/08/22 MRI cervical -Moderate degenerative disc disease at C4-5, C5-6 and C6-7.  -No spinal stenosis. Normal cervical spinal cord.   12/08/22 MRI thoracic -Degenerative changes as described above, most pronounced at T10-T11 and T11-T12 resulting in mild spinal canal and neuroforaminal stenosis.   12/08/22 MRI lumbar spine -Disc desiccation, facet arthrosis, levoscoliosis and a grade 1 anterolisthesis at L4-L5 resulting in mild spinal canal and neuroforaminal stenosis, as described above.    ASSESSMENT AND PLAN  62 y.o. year old female here with:   Dx:  1. Parkinson's  disease with dyskinesia without fluctuating manifestations     PLAN:  Idiopathic parkinson's disease (since ~2023; gradual onset progressive cogwheel rigidity, bradykinesia, gait difficulty, mainly affecting left side; may represent akinetic rigid parkinsonism) - CONTINUE carbidopa / levodopa (25/100) 1 tab three times a day with meals - may consider rasagiline, amantadine in future - encouraged to optimize nutrition, exercises, sleep and stress mgmt  Meds ordered this encounter  Medications   carbidopa-levodopa (SINEMET IR) 25-100 MG tablet    Sig: Take 1 tablet by mouth 3 (three) times daily before meals.    Dispense:  270 tablet    Refill:  4   Return in about 1 year (around 04/20/2024).    Suanne Marker, MD 04/21/2023, 2:38 PM Certified in Neurology, Neurophysiology and Neuroimaging  [facility] [address], [address] [place], [place] [address] [phone]

## 2023-05-27 ENCOUNTER — Encounter (HOSPITAL_BASED_OUTPATIENT_CLINIC_OR_DEPARTMENT_OTHER): Payer: Self-pay | Admitting: Cardiology

## 2023-07-16 ENCOUNTER — Encounter (INDEPENDENT_AMBULATORY_CARE_PROVIDER_SITE_OTHER): Payer: Self-pay

## 2023-07-24 ENCOUNTER — Other Ambulatory Visit: Payer: Self-pay | Admitting: Physician Assistant

## 2023-08-10 ENCOUNTER — Encounter: Payer: Self-pay | Admitting: Physician Assistant

## 2023-08-10 ENCOUNTER — Ambulatory Visit (INDEPENDENT_AMBULATORY_CARE_PROVIDER_SITE_OTHER): Payer: Managed Care, Other (non HMO) | Admitting: Physician Assistant

## 2023-08-10 ENCOUNTER — Encounter: Payer: Self-pay | Admitting: Neurology

## 2023-08-10 VITALS — BP 160/100 | HR 84 | Temp 97.6°F | Ht 69.0 in | Wt 280.6 lb

## 2023-08-10 DIAGNOSIS — F419 Anxiety disorder, unspecified: Secondary | ICD-10-CM

## 2023-08-10 DIAGNOSIS — E785 Hyperlipidemia, unspecified: Secondary | ICD-10-CM | POA: Diagnosis not present

## 2023-08-10 DIAGNOSIS — R030 Elevated blood-pressure reading, without diagnosis of hypertension: Secondary | ICD-10-CM | POA: Diagnosis not present

## 2023-08-10 DIAGNOSIS — Z23 Encounter for immunization: Secondary | ICD-10-CM | POA: Diagnosis not present

## 2023-08-10 DIAGNOSIS — R7309 Other abnormal glucose: Secondary | ICD-10-CM | POA: Diagnosis not present

## 2023-08-10 DIAGNOSIS — Z0000 Encounter for general adult medical examination without abnormal findings: Secondary | ICD-10-CM

## 2023-08-10 DIAGNOSIS — G20B1 Parkinson's disease with dyskinesia, without mention of fluctuations: Secondary | ICD-10-CM

## 2023-08-10 LAB — LIPID PANEL
Cholesterol: 229 mg/dL — ABNORMAL HIGH (ref 0–200)
HDL: 66.3 mg/dL (ref >39.00)
LDL Cholesterol: 142 mg/dL — ABNORMAL HIGH (ref 0–99)
NonHDL: 162.81
Total CHOL/HDL Ratio: 3
Triglycerides: 102 mg/dL (ref 0.0–149.0)
VLDL: 20.4 mg/dL (ref 0.0–40.0)

## 2023-08-10 LAB — COMPREHENSIVE METABOLIC PANEL
ALT: 5 U/L (ref 0–35)
AST: 17 U/L (ref 0–37)
Albumin: 4.1 g/dL (ref 3.5–5.2)
Alkaline Phosphatase: 86 U/L (ref 39–117)
BUN: 18 mg/dL (ref 6–23)
CO2: 31 meq/L (ref 19–32)
Calcium: 9.8 mg/dL (ref 8.4–10.5)
Chloride: 103 meq/L (ref 96–112)
Creatinine, Ser: 0.87 mg/dL (ref 0.40–1.20)
GFR: 71.1 mL/min (ref >60.00)
Glucose, Bld: 96 mg/dL (ref 70–99)
Potassium: 4.5 meq/L (ref 3.5–5.1)
Sodium: 139 meq/L (ref 135–145)
Total Bilirubin: 0.6 mg/dL (ref 0.2–1.2)
Total Protein: 6.9 g/dL (ref 6.0–8.3)

## 2023-08-10 LAB — CBC WITH DIFFERENTIAL/PLATELET
Basophils Absolute: 0.1 10*3/uL (ref 0.0–0.1)
Basophils Relative: 1.3 % (ref 0.0–3.0)
Eosinophils Absolute: 0.1 10*3/uL (ref 0.0–0.7)
Eosinophils Relative: 2.7 % (ref 0.0–5.0)
HCT: 43.5 % (ref 36.0–46.0)
Hemoglobin: 14.2 g/dL (ref 12.0–15.0)
Lymphocytes Relative: 29.7 % (ref 12.0–46.0)
Lymphs Abs: 1.4 10*3/uL (ref 0.7–4.0)
MCHC: 32.5 g/dL (ref 30.0–36.0)
MCV: 95.5 fl (ref 78.0–100.0)
Monocytes Absolute: 0.4 10*3/uL (ref 0.1–1.0)
Monocytes Relative: 8.6 % (ref 3.0–12.0)
Neutro Abs: 2.7 10*3/uL (ref 1.4–7.7)
Neutrophils Relative %: 57.7 % (ref 43.0–77.0)
Platelets: 234 10*3/uL (ref 150.0–400.0)
RBC: 4.56 Mil/uL (ref 3.87–5.11)
RDW: 13.8 % (ref 11.5–15.5)
WBC: 4.6 10*3/uL (ref 4.0–10.5)

## 2023-08-10 LAB — HEMOGLOBIN A1C: Hgb A1c MFr Bld: 6.1 % (ref 4.6–6.5)

## 2023-08-10 MED ORDER — MELOXICAM 7.5 MG PO TABS: 7.5000 mg | ORAL_TABLET | Freq: Every day | ORAL | 1 refills | Status: DC

## 2023-08-10 NOTE — Progress Notes (Signed)
Subjective:    Susan Bender is a 62 y.o. female and is here for a comprehensive physical exam.  HPI  Health Maintenance Due  Topic Date Due   Zoster Vaccines- Shingrix (1 of 2) Never done    Acute Concerns: Elevated blood pressure Currently taking no medication. At home blood pressure readings are: not consistently checked. Patient denies chest pain, SOB, blurred vision, dizziness, unusual headaches, lower leg swelling. Patient is compliant with medication. Denies excessive caffeine intake, stimulant usage, excessive alcohol intake, or increase in salt consumption.  BP Readings from Last 3 Encounters:  08/10/23 (!) 158/88  04/21/23 (!) 142/89  04/10/23 136/78   Chronic Issues: Parkinsons Remains on sinemet IR 25-100 mg three times daily  Sees [facility]   Anxiety Currently taking citalopram 20 mg daily Denies suicidal ideation/hi Overall doing well  Health Maintenance: Immunizations -- utd Colonoscopy -- UpToDate -- due in 2028 Mammogram -- scheduled in oc  PAP -- utd Bone Density -- had several years ago with obstetrics-gynecology -- normal Diet -- working on healthy eating Exercise -- yoga three times a week  Sleep habits -- intermittent issues with falling asleep Mood -- overall stable  UTD with dentist? - yes UTD with eye doctor? - yes  Weight history: Wt Readings from Last 10 Encounters:  08/10/23 280 lb 9.6 oz (127.3 kg)  04/21/23 278 lb (126.1 kg)  04/10/23 280 lb 4.8 oz (127.1 kg)  12/15/22 281 lb (127.5 kg)  11/18/22 280 lb (127 kg)  08/05/22 277 lb (125.6 kg)  06/06/22 271 lb (122.9 kg)  03/05/22 266 lb (120.7 kg)  08/02/21 265 lb 8 oz (120.4 kg)  02/25/21 286 lb 6.4 oz (129.9 kg)   Body mass index is 41.44 kg/m. No LMP recorded. Patient is postmenopausal.  Alcohol use:  reports current alcohol use.  Tobacco use:  Tobacco Use: Low Risk  (08/10/2023)   Patient History    Smoking Tobacco Use: Never    Smokeless  Tobacco Use: Never    Passive Exposure: Not on file   Eligible for lung cancer screening? no     08/05/2022    7:53 AM  Depression screen PHQ 2/9  Decreased Interest 0  Down, Depressed, Hopeless 0  PHQ - 2 Score 0     Other providers/specialists: Patient Care Team: Jarold Motto, [place] as PCP - General (Physician Assistant) Jodelle Red, MD as PCP - Cardiology (Cardiology)    PMHx, SurgHx, SocialHx, Medications, and Allergies were reviewed in the Visit Navigator and updated as appropriate.   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   WRIST SURGERY Right 2019     Family History  Problem Relation Age of Onset   Arthritis Mother    Alzheimer's disease Mother    Diabetes Father    Heart attack Father    Heart disease Father    Hypertension Father    Parkinson's disease Father    COPD Sister    Diabetes Sister    Hypertension Sister    Asthma Sister  Heart attack Paternal Grandfather    Heart disease Paternal Grandfather    Breast cancer Paternal Aunt    Multiple sclerosis Cousin     Social History   Tobacco Use   Smoking status: Never   Smokeless tobacco: Never  Vaping Use   Vaping status: Never Used  Substance Use Topics   Alcohol use: Yes    Comment: occassional glass of wine   Drug use: Never    Review of Systems:   Review of Systems  Constitutional:  Negative for chills, fever, malaise/fatigue and weight loss.  HENT:  Negative for hearing loss, sinus pain and sore throat.   Respiratory:  Negative for cough and hemoptysis.   Cardiovascular:  Negative for chest pain, palpitations, leg swelling and  PND.  Gastrointestinal:  Negative for abdominal pain, constipation, diarrhea, heartburn, nausea and vomiting.  Genitourinary:  Negative for dysuria, frequency and urgency.  Musculoskeletal:  Negative for back pain, myalgias and neck pain.  Skin:  Negative for itching and rash.  Neurological:  Negative for dizziness, tingling, seizures and headaches.  Endo/Heme/Allergies:  Negative for polydipsia.  Psychiatric/Behavioral:  Negative for depression. The patient is not nervous/anxious.     Objective:   BP (!) 158/88   Pulse 84   Temp 97.6 F (36.4 C)   Ht 5\' 9"  (1.753 m)   Wt 280 lb 9.6 oz (127.3 kg)   SpO2 97%   BMI 41.44 kg/m  Body mass index is 41.44 kg/m.   General Appearance:    Alert, cooperative, no distress, appears stated age  Head:    Normocephalic, without obvious abnormality, atraumatic  Eyes:    PERRL, conjunctiva/corneas clear, EOM's intact, fundi    benign, both eyes  Ears:    Normal TM's and external ear canals, both ears  Nose:   Nares normal, septum midline, mucosa normal, no drainage    or sinus tenderness  Throat:   Lips, mucosa, and tongue normal; teeth and gums normal  Neck:   Supple, symmetrical, trachea midline, no adenopathy;    thyroid:  no enlargement/tenderness/nodules; no carotid   bruit or JVD  Back:     Symmetric, no curvature, ROM normal, no CVA tenderness  Lungs:     Clear to auscultation bilaterally, respirations unlabored  Chest Wall:    No tenderness or deformity   Heart:    Regular rate and rhythm, S1 and S2 normal, no murmur, rub or gallop  Breast Exam:    Deferred  Abdomen:     Soft, non-tender, bowel sounds active all four quadrants,    no masses, no organomegaly  Genitalia:    Deferred  Extremities:   Extremities normal, atraumatic, no cyanosis or edema  Pulses:   2+ and symmetric all extremities  Skin:   Skin color, texture, turgor normal, no rashes or lesions  Lymph nodes:   Cervical, supraclavicular, and axillary nodes normal   Neurologic:   CNII-XII intact, normal strength, sensation and reflexes    throughout    Assessment/Plan:   Routine physical examination Today patient counseled on age appropriate routine health concerns for screening and prevention, each reviewed and up to date or declined. Immunizations reviewed and up to date or declined. Labs ordered and reviewed. Risk factors for depression reviewed and negative. Hearing function and visual acuity are intact. ADLs screened and addressed as needed. Functional ability and level of safety reviewed and appropriate. Education, counseling and referrals performed based on assessed risks today. Patient provided with a copy of personalized plan  for preventive services.  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

## 2023-08-10 NOTE — Patient Instructions (Addendum)
It was great to see you!  Dr Lurena Joiner Tat [facility]  Keep an eye on your blood pressure  Please go to the lab for blood work.   Our office will call you with your results unless you have chosen to receive results via MyChart.  If your blood work is normal we will follow-up each year for physicals and as scheduled for chronic medical problems.  If anything is abnormal we will treat accordingly and get you in for a follow-up.  Take care,  Lelon Mast

## 2023-08-21 ENCOUNTER — Encounter: Payer: Self-pay | Admitting: Physician Assistant

## 2023-08-24 NOTE — Telephone Encounter (Signed)
Please see message and advise 

## 2023-08-25 MED ORDER — WEGOVY 0.25 MG/0.5ML ~~LOC~~ SOAJ: 0.2500 mg | SUBCUTANEOUS | 0 refills | Status: DC

## 2023-08-31 ENCOUNTER — Other Ambulatory Visit: Payer: Self-pay | Admitting: Physician Assistant

## 2023-08-31 DIAGNOSIS — Z1231 Encounter for screening mammogram for malignant neoplasm of breast: Secondary | ICD-10-CM

## 2023-09-02 DIAGNOSIS — Z1231 Encounter for screening mammogram for malignant neoplasm of breast: Secondary | ICD-10-CM

## 2023-09-07 NOTE — Progress Notes (Unsigned)
Assessment/Plan:    Parkinson's disease.  The patient has tremor, bradykinesia, rigidity and mild postural instability.  -We discussed the diagnosis as well as pathophysiology of the disease.  We discussed treatment options as well as prognostic indicators.  Patient education was provided.  -We discussed that it used to be thought that levodopa would increase risk of melanoma but now it is believed that Parkinsons itself likely increases risk of melanoma. she is to get regular skin checks.  She was given names of resources/clinics for this.  -Greater than 50% of the 60 minute visit was spent in counseling answering questions and talking about what to expect now as well as in the future.  We talked about medication options as well as potential future surgical options.  We talked about safety in the home.  -She will continue on carbidopa/levodopa 25/100, 1 tablet 3 times per day.  Suspect that we will need to increase this in the future.  -Will add rotigotine patch 2 mg daily for a week and then increase to 4 mg daily.  She needs once daily dosing because of her job and the difficulty that it is to get in 3 times per day dosing.  -We discussed community resources in the area including patient support groups and community exercise programs for PD and pt education was provided to the patient.  -Discussed with patient at the study through Parkinsons foundation called [employer], given that her father has Parkinson's disease.   Subjective:   Susan Bender was seen today in the movement disorders clinic for neurologic consultation at the request of Jarold Motto, [place].  The consultation is for the evaluation of Parkinsons disease.  This is a second opinion.  Patient currently under the care of [facility] neurology, Dr. Marjory Lies.  She has been to 2 visits with Dr. Marjory Lies.  She first saw him in January, 2024 with complaints of left-sided weakness and decreased dexterity on the left x 1 year.  Dr.  Marjory Lies noticed increased tone on the left hand bradykinesia on the left and she was diagnosed with Parkinsons disease.  She was started on levodopa.  She followed up in May, 2024.  Medications were not changed at that visit.  She works as a TEFL teacher and its very hard to get tid dosing in.  She thinks that medication helps but when she takes it closer she may have more dystonia but isn't sure.  She has toe curling day and night.    Pt is R hand dominant  Current mov't meds: carbidopa/levodopa 25/100, 1 po tid (when working 5:30am, 11am-1pm, 5-6pm)   Specific Symptoms:  Tremor: No. Family hx of similar:  No. father had Parkinsons disease (unknown age of dx but she thinks mid 49s) Voice: no change Sleep: she may/may not sleep well - may have awakenings in the middle of the night  Vivid Dreams:  No.  Acting out dreams:  No. (Living alone) Wet Pillows: Yes.  , just a little Postural symptoms:  Yes.  , just a little (now doing yoga 3 times a week)  Falls?  Yes.  , 3 weeks ago, tripped over a cord at work in the OR Bradykinesia symptoms: slow movements and difficulty getting out of a chair Loss of smell:  No. Loss of taste:  No. Urinary Incontinence:  No. Difficulty Swallowing:  No. Handwriting, micrographia: No. Trouble with ADL's:  No.  Trouble buttoning clothing: No. Depression:  No. Memory changes:  No. Hallucinations:  No.  visual  distortions: No. (Has occ floaters) N/V:  No. Lightheaded:  No.  Syncope: No. Diplopia:  No. Dyskinesia:  No.   Patient had an MRI of the brain in January, 2024.  Films are not available to me, but I do have the report, which noted several small T2 hyperintensities throughout the frontal and parietal lobes.  PREVIOUS MEDICATIONS: Sinemet  ALLERGIES:   Allergies  Allergen Reactions   Sulfa Antibiotics Hives and Rash    CURRENT MEDICATIONS:  Current Meds  Medication Sig   carbidopa-levodopa (SINEMET IR) 25-100 MG tablet Take 1 tablet by  mouth 3 (three) times daily before meals.   citalopram (CELEXA) 20 MG tablet TAKE 1 TABLET(20 MG) BY MOUTH DAILY   meloxicam (MOBIC) 7.5 MG tablet Take 1 tablet (7.5 mg total) by mouth daily.   Multiple Vitamins-Minerals (ONE-A-DAY WOMENS VITACRAVES) CHEW Chew 1 each by mouth daily.     Objective:   VITALS:   Vitals:   09/09/23 0957  BP: 132/85  Pulse: 86  SpO2: 98%  Weight: 281 lb 12.8 oz (127.8 kg)  Height: 5\' 9"  (1.753 m)    GEN:  The patient appears stated age and is in NAD. HEENT:  Normocephalic, atraumatic.  The mucous membranes are moist. The superficial temporal arteries are without ropiness or tenderness. CV:  RRR Lungs:  CTAB Neck/HEME:  There are no carotid bruits bilaterally.  Neurological examination:  Orientation: The patient is alert and oriented x3.  Cranial nerves: There is good facial symmetry. There is mild facial hypomimia.  Extraocular muscles are intact. The visual fields are full to confrontational testing. The speech is fluent and clear. Soft palate rises symmetrically and there is no tongue deviation. Hearing is intact to conversational tone. Sensation: Sensation is intact to light and pinprick throughout (facial, trunk, extremities). Vibration is intact at the bilateral big toe. There is no extinction with double simultaneous stimulation. There is no sensory dermatomal level identified. Motor: Strength is 5/5 in the bilateral upper and lower extremities.   Shoulder shrug is equal and symmetric.  There is no pronator drift. Deep tendon reflexes: Deep tendon reflexes are 2/4 at the bilateral biceps, triceps, brachioradialis, patella and achilles. Plantar responses are downgoing bilaterally.  Movement examination: Tone: There is mild to mod increased tone in the LUE/LLE.  There is nl tone in the RUE/RLE Abnormal movements: none Coordination:  There is mod decremation with RAM's, with any form of RAMS, including alternating supination and pronation of the  forearm, hand opening and closing, finger taps, heel taps and toe taps on the R.   Gait and Station: The patient has no difficulty arising out of a deep-seated chair without the use of the hands. The patient's stride length is min decreased with decreased arm swing on the L.  The patient has a neg pull test.     I have reviewed and interpreted the following labs independently   Chemistry      Component Value Date/Time   NA 139 08/10/2023 0933   NA 143 10/01/2017 0000   K 4.5 08/10/2023 0933   CL 103 08/10/2023 0933   CO2 31 08/10/2023 0933   BUN 18 08/10/2023 0933   BUN 12 10/01/2017 0000   CREATININE 0.87 08/10/2023 0933   CREATININE 0.89 07/31/2020 1116   GLU 109 10/01/2017 0000      Component Value Date/Time   CALCIUM 9.8 08/10/2023 0933   ALKPHOS 86 08/10/2023 0933   AST 17 08/10/2023 0933   ALT 5 08/10/2023 0933  BILITOT 0.6 08/10/2023 0933      Lab Results  Component Value Date   TSH 1.28 10/01/2017   Lab Results  Component Value Date   WBC 4.6 08/10/2023   HGB 14.2 08/10/2023   HCT 43.5 08/10/2023   MCV 95.5 08/10/2023   PLT 234.0 08/10/2023     Total time spent on today's visit was 60 minutes, including both face-to-face time and nonface-to-face time.  Time included that spent on review of records (prior notes available to me/labs/imaging if pertinent), discussing treatment and goals, answering patient's questions and coordinating care.  Cc:  Jarold Motto, PA

## 2023-09-09 ENCOUNTER — Telehealth: Payer: Self-pay

## 2023-09-09 ENCOUNTER — Ambulatory Visit: Payer: Managed Care, Other (non HMO) | Admitting: Neurology

## 2023-09-09 ENCOUNTER — Encounter: Payer: Self-pay | Admitting: Neurology

## 2023-09-09 VITALS — BP 132/85 | HR 86 | Ht 69.0 in | Wt 281.8 lb

## 2023-09-09 DIAGNOSIS — G20A1 Parkinson's disease without dyskinesia, without mention of fluctuations: Secondary | ICD-10-CM | POA: Diagnosis not present

## 2023-09-09 MED ORDER — ROTIGOTINE 4 MG/24HR TD PT24: 1.0000 | MEDICATED_PATCH | Freq: Every day | TRANSDERMAL | 1 refills | Status: DC

## 2023-09-09 NOTE — Patient Instructions (Addendum)
Start neupro 2 mg daily for 1 week and then increase to 4 mg daily.  We may need more than this in the future and may need to increase your levodopa.  We will start with this.  Look on the neupro website and print out a coupon!  As we discussed, it used to be thought that levodopa would increase risk of melanoma but now it is believed that Parkinsons itself likely increases risk of melanoma. I recommend yearly skin checks with a board certified dermatologist.    [facility] Locations: [facility] [address] [place], [place] [address] [phone]  [facility] Address: [address] [place], [place], [place] [address] Phone: [phone]  [facility] [address] [place], [place], [place] Phone: [phone]  [facility] [address], [place], [place] [address] Phone: [phone]  [place]: [facility] [address] [place], [place] [address] Phone: [phone]  [facility] [address] [place], [place], [place] [address] Phone: [phone]  Local and Online Resources for Power over Parkinson's Group?  October 2024  ?  LOCAL Country Walk PARKINSON'S GROUPS??  Power over [employer] Group:???  Power Over Parkinson's Patient Education Group -NO GROUP MEETING in October due to the Parkinson's Symposium Upcoming Power over [employer] Meetings/Care Partner Support:? 2nd Wednesdays of the month at 2 pm: Next meeting November 13th Contact Amy Marriott at [email or web address] or Lynwood Dawley at [email or web address] if interested in participating in this group?  ?  LOCAL EVENTS AND NEW OFFERINGS?  Parkinson's Social Game Night.  First Thursday of each month, 2:00-4:00 pm.  Rossie Muskrat [facility] [place].  Contact [email or web address] if interested.  2024 Movement Disorders Symposium.  Friday, September 11, 2023 9 am-2 pm.  [place] and [place], [address], [place].  Free to attend; registration required.  Register at [email or web address] Parkinson's CarePartner Group for Men is in the works, if interested email Sarah ?Doctor, [email or web address]  ACT [employer] Chair Yoga classes "Train and Gain", Fridays 10 am, [employer].  Contact Gina at [phone].   PWR! Moves [place] class!  Wednesdays at 10 am.  Please contact Lonia Blood, PT at [email or web address] if interested. Health visitor Classes offering at [employer]!? Tuesdays (Chair Yoga)  and Wednesdays (PWR! Moves)  1:00 pm.?? Contact Aldona Lento [phone] or [email or web address] Drumming for Parkinson's will be held on 2nd and 4th Mondays at 11:00 am.?? Located at the [place] of the [employer] ([address] [place].)? Contact Albertina Parr at [email or web address] or [phone]?  [employer] Parkinson's Tai Chi Class, Mondays at 11 am.  Call [phone] for details  [facility] at [facility] [address], [place] Wednesdays- 4:00 - 5:00 PM - specifically for Parkinson's Disease.  Free!  Contact Denny Peon, [place] - [phone] (clinic) or  [phone] (cell) or by email: [email or web address]   ?ONLINE EDUCATION AND SUPPORT?  Parkinson Foundation:? www.parkinson.org?  PD Health at Home continues:? Mindfulness Mondays, Wellness Wednesdays, Fitness Fridays??  Upcoming Education:??  Safe Movement in the Hospital.   Wednesday, October 2, 1-2 pm  Parkinson's 101:  What you and your family should know.  Wednesday, October 16th,  1-2:15 pm  Navigating Long Term Care with Parkinson's.   Tuesday, October 22nd, 1-2pm  Gene and Cell Based therapies in Parkinson's.  Wednesday, October 30th, 1-2 pm Expert Briefing:    More than PD:  Managing Multiple chronic Conditions.  Wednesday, October 9th, 1-2 pm [employer]  on your mind?  Thinking  and memory changes.  Wednesday, November 13th, 1-2 pm Register for virtual education and Photographer (webinars) at [employer].gl  Please check out their website to sign up for emails and see their full online offerings??  ?  [employer] Foundation:? www.michaeljfox.org??  Third Thursday Webinars:? On the third Thursday of every month at 12 p.m. ET, join our free live webinars to learn about various aspects of living with Parkinson's disease and our work to speed medical breakthroughs.?  Upcoming Webinar:? Hitting Stride:  Research Advances on Walking with Parkinson's.  Thursday, October 17th  at 12 noon.  Check out additional information on their website to see their full online offerings?  ?  [employer]:? www.[email or web address].org?  Upcoming Webinar:   Stay tuned/check website Series:? Living with Parkinson's Meetup.?? Third Thursdays each month, 3 pm?  Care Partner Monthly Meetup.? With Jillene Bucks [place].? First Tuesday of each month, 2 pm?  Check out additional information to Live Well Today on their website?  ?  Parkinson and Movement Disorders (PMD) Alliance:? www.pmdalliance.org?  NeuroLife Online:? Online Education Events?  Sign up for emails, which are sent weekly to give you updates on programming and online offerings?  ?  Parkinson's Association of the [place]:? www.parkinsonassociation.org?  Information on online support groups, education events, and online exercises including Yoga, Parkinson's exercises and more-LOTS of information on links to PD resources and online events?  Virtual Support Group through [employer] of the [place]-October 2nd at 2 pm Save the Date:  Merchandiser, retail.  October 03, 2023 9am-4 pm. [facility].  Registration Coming soon.   MOVEMENT AND EXERCISE OPPORTUNITIES?  PWR! Moves [place] class has returned!  Wednesdays at 10 am.  Please contact Lonia Blood, PT at [email or web address] if interested. Parkinson's Exercise Class offerings at [employer]. Tuesdays (Chair yoga) and Wednesdays (PWR! Moves)  1:00 pm.?  Contact Aldona Lento [phone] or [email or web address]  Parkinson's Wellness Recovery (PWR! Moves)? www.pwr4life.org?  Info on the PWR! Virtual Experience:? You will have access to our expertise?through self-assessment, guided plans that start with the PD-specific fundamentals, educational content, tips, Q&A with an expert, and a growing Engineering geologist of PD-specific pre-recorded and live exercise classes of varying types and intensity - both physical and cognitive! If that is not enough, we offer 1:1 wellness consultations (in-person or virtual) to personalize your PWR! Dance movement psychotherapist.??  Parkinson [employer] Fridays:??  As part of the PD Health @ Home program, this free video series focuses each week on one aspect of fitness designed to support people living with Parkinson's.? These weekly videos highlight the Parkinson Foundation fitness guidelines for people with Parkinson's disease.?  MenusLocal.com.br?  Dance for PD website is offering free, live-stream classes throughout the week, as well as links to [employer] of classes:? https://danceforparkinsons.org/?  Virtual dance and Pilates for Parkinson's classes: Click on the Community Tab> Parkinson's Movement Initiative Tab.? To register for classes and for more information, visit www.NoteBack.co.za and click the "community" tab.??  [employer] Parkinson's Cycling Classes??  [employer]:? Thursdays @ Noon-Live classes at [employer] ([employer] at [email or web address]?or [phone])?  [employer]: Classes Tuesday, Wednesday and Thursday (contact [place] at [email or web address] ?or [phone])?  [employer]?  Varied levels of classes are offered  Tuesdays and Thursdays at [facility]??  Stretching with Byrd Hesselbach weekly class is also offered for people with Parkinson's?  To observe a class or for more information, call [phone] or email Patricia Nettle at info@[email or web address] [email or web address]?  ADDITIONAL SUPPORT AND RESOURCES?  Well[employer]Spring Solutions:  Chiropractor:? www.well-springsolutions.org/caregiver-education/caregiver-support-group.? You may also contact Loleta Chance at [facility] -spring.org or [phone].????  Well-Spring Navigator:? Just1Navigator program, a?free service to help individuals and families through the journey of determining care for older adults.? The "Navigator" is a Child psychotherapist, Sidney Ace, who will speak with a prospective client and/or loved ones to provide an assessment of the situation and a set of recommendations for a personalized care plan -- all free of charge, and whether?Well-Spring [employer] offers the needed service or not. If the need is not a service we provide, we are well-connected with reputable programs in town that we can refer you to.? www.well-springsolutions.org or to speak with the Navigator, call [phone].?   [facility] [address], [place], [place] [address] Phone: [phone]

## 2023-09-09 NOTE — Telephone Encounter (Signed)
Pharmacy Patient Advocate Encounter   Received notification from CoverMyMeds that prior authorization for [facility] 0.25MG /0.5ML auto-injectors is required/requested.   Insurance verification completed.   The patient is insured through [facility] .   Per test claim: PA required; PA submitted to [facility] via CoverMyMeds Key/confirmation #/EOC [id number] Status is pending

## 2023-09-10 ENCOUNTER — Other Ambulatory Visit (HOSPITAL_COMMUNITY): Payer: Self-pay

## 2023-09-10 ENCOUNTER — Telehealth: Payer: Self-pay | Admitting: Pharmacy Technician

## 2023-09-10 NOTE — Telephone Encounter (Signed)
Pharmacy Patient Advocate Encounter   Received notification from Patient Advice Request messages that prior authorization for NEUPRO 4MG  PATCH is required/requested.   Insurance verification completed.   The patient is insured through [facility] .   Per test claim: PA required; PA submitted to [facility] via CoverMyMeds Key/confirmation #/EOC [id number] Status is pending

## 2023-09-11 ENCOUNTER — Other Ambulatory Visit (HOSPITAL_COMMUNITY): Payer: Self-pay

## 2023-09-11 NOTE — Telephone Encounter (Signed)
Pharmacy Patient Advocate Encounter  Received notification from [facility] that Prior Authorization for NEUPRO 4MG  has been APPROVED from 10.10.24 to 10.9.25. Ran test claim, Copay is $295. This test claim was processed through [facility]- copay amounts may vary at other pharmacies due to pharmacy/plan contracts, or as the patient moves through the different stages of their insurance plan.   PA #/Case ID/Reference #: [id number]

## 2023-09-11 NOTE — Telephone Encounter (Signed)
Pharmacy Patient Advocate Encounter  Received notification from [facility] that Prior Authorization for Wegovy 0.25MG /0.5ML auto-injectors has been APPROVED from 09/11/23 to 04/01/24   PA #/Case ID/Reference #: [id number]   This medication must be filled through [facility] or [facility].

## 2023-09-15 ENCOUNTER — Telehealth: Payer: Self-pay | Admitting: Physician Assistant

## 2023-09-15 MED ORDER — WEGOVY 0.25 MG/0.5ML ~~LOC~~ SOAJ: 0.2500 mg | SUBCUTANEOUS | 0 refills | Status: DC

## 2023-09-15 NOTE — Telephone Encounter (Signed)
Left message on voicemail to call office.

## 2023-09-15 NOTE — Telephone Encounter (Signed)
err

## 2023-09-15 NOTE — Addendum Note (Signed)
Addended by: Jimmye Norman on: 09/15/2023 02:32 PM   Modules accepted: Orders

## 2023-09-15 NOTE — Telephone Encounter (Signed)
Patient returned call. Requests to be called. 

## 2023-09-15 NOTE — Telephone Encounter (Signed)
Spoke to pt told her PA for Reginal Lutes has been approved, but Rx must be sent to [facility]. Pt verbalized understanding. [employer] and told her I will cancel Rx at [facility]. Pt verbalized understanding.  Called [facility] and spoke to [place] told her to cancel Rx for [facility] due to sent to wrong pharmacy. Bridgett Verbalized understanding and said Rx was closed out. Told her okay.

## 2023-09-15 NOTE — Telephone Encounter (Signed)
See other message

## 2023-10-02 ENCOUNTER — Encounter: Payer: Self-pay | Admitting: Physician Assistant

## 2023-10-29 ENCOUNTER — Other Ambulatory Visit: Payer: Self-pay | Admitting: Physician Assistant

## 2023-11-02 MED ORDER — CITALOPRAM HYDROBROMIDE 20 MG PO TABS: 20.0000 mg | ORAL_TABLET | Freq: Every day | ORAL | 0 refills | Status: DC

## 2023-12-08 ENCOUNTER — Ambulatory Visit (INDEPENDENT_AMBULATORY_CARE_PROVIDER_SITE_OTHER): Payer: Managed Care, Other (non HMO)

## 2023-12-08 DIAGNOSIS — Z23 Encounter for immunization: Secondary | ICD-10-CM

## 2023-12-08 NOTE — Progress Notes (Signed)
 Patient is in office today for a nurse visit for  Shingles #2 , per PCP's order. Patient Injection was given in the  Right deltoid. Patient tolerated injection well.

## 2023-12-09 ENCOUNTER — Encounter: Payer: Self-pay | Admitting: Physician Assistant

## 2023-12-23 ENCOUNTER — Telehealth: Payer: Self-pay | Admitting: Diagnostic Neuroimaging

## 2023-12-23 NOTE — Telephone Encounter (Signed)
Pt LVM at 9:38 am; have an upcoming appt with Dr.  Marjory Lies on 04/19/24. Need to cancel that appt, actually have started seeing another neurologist in the [facility] system. Will be rescheduling that appt on 04/19/24

## 2024-01-25 ENCOUNTER — Other Ambulatory Visit: Payer: Self-pay | Admitting: Physician Assistant

## 2024-01-25 NOTE — Progress Notes (Unsigned)
 Assessment/Plan:   1.  Parkinsons Disease  -***   Subjective:   Susan Bender was seen today in follow up for Parkinsons disease.  My previous records were reviewed prior to todays visit as well as outside records available to me.  We started patient on rotigotine last visit for not only help with rigidity, but for ease of dosing required with her job.  She reports today that ***.  She has not had compulsive behaviors or sleep attacks.  No hallucinations.  Pt denies falls.  Pt denies lightheadedness, near syncope.   Mood has been good.  Current prescribed movement disorder medications: ***Carbidopa/levodopa 25/100, 1 tablet 3 times per day Rotigotine patch, 4 mg daily (started last visit)   PREVIOUS MEDICATIONS: {Parkinson's RX:18200}  ALLERGIES:   Allergies  Allergen Reactions   Sulfa Antibiotics Hives and Rash    CURRENT MEDICATIONS:  No outpatient medications have been marked as taking for the 01/26/24 encounter (Appointment) with Naseem Varden, Octaviano Batty, DO.     Objective:   PHYSICAL EXAMINATION:    VITALS:  There were no vitals filed for this visit.  GEN:  The patient appears stated age and is in NAD. HEENT:  Normocephalic, atraumatic.  The mucous membranes are moist. The superficial temporal arteries are without ropiness or tenderness. CV:  RRR Lungs:  CTAB Neck/HEME:  There are no carotid bruits bilaterally.  Neurological examination:  Orientation: The patient is alert and oriented x3. Cranial nerves: There is good facial symmetry with*** facial hypomimia. The speech is fluent and clear. Soft palate rises symmetrically and there is no tongue deviation. Hearing is intact to conversational tone. Sensation: Sensation is intact to light touch throughout Motor: Strength is at least antigravity x4.  Movement examination: Tone: There is mild to mod increased tone in the LUE/LLE.  There is nl tone in the RUE/RLE Abnormal movements: none Coordination:  There is mod  decremation with RAM's, with any form of RAMS, including alternating supination and pronation of the forearm, hand opening and closing, finger taps, heel taps and toe taps on the R.   Gait and Station: The patient has no difficulty arising out of a deep-seated chair without the use of the hands. The patient's stride length is min decreased with decreased arm swing on the L.  The patient has a neg pull test.   I have reviewed and interpreted the following labs independently    Chemistry      Component Value Date/Time   NA 139 08/10/2023 0933   NA 143 10/01/2017 0000   K 4.5 08/10/2023 0933   CL 103 08/10/2023 0933   CO2 31 08/10/2023 0933   BUN 18 08/10/2023 0933   BUN 12 10/01/2017 0000   CREATININE 0.87 08/10/2023 0933   CREATININE 0.89 07/31/2020 1116   GLU 109 10/01/2017 0000      Component Value Date/Time   CALCIUM 9.8 08/10/2023 0933   ALKPHOS 86 08/10/2023 0933   AST 17 08/10/2023 0933   ALT 5 08/10/2023 0933   BILITOT 0.6 08/10/2023 0933       Lab Results  Component Value Date   WBC 4.6 08/10/2023   HGB 14.2 08/10/2023   HCT 43.5 08/10/2023   MCV 95.5 08/10/2023   PLT 234.0 08/10/2023    Lab Results  Component Value Date   TSH 1.28 10/01/2017     Total time spent on today's visit was ***30 minutes, including both face-to-face time and nonface-to-face time.  Time included that spent on  review of records (prior notes available to me/labs/imaging if pertinent), discussing treatment and goals, answering patient's questions and coordinating care.  Cc:  Jarold Motto, PA

## 2024-01-26 ENCOUNTER — Encounter: Payer: Self-pay | Admitting: Neurology

## 2024-01-26 ENCOUNTER — Ambulatory Visit: Payer: Managed Care, Other (non HMO) | Admitting: Neurology

## 2024-01-26 VITALS — BP 152/90 | HR 103 | Ht 69.0 in | Wt 284.0 lb

## 2024-01-26 DIAGNOSIS — G20B1 Parkinson's disease with dyskinesia, without mention of fluctuations: Secondary | ICD-10-CM

## 2024-01-26 NOTE — Patient Instructions (Signed)
 As we discussed, it used to be thought that levodopa would increase risk of melanoma but now it is believed that Parkinsons itself likely increases risk of melanoma. I recommend yearly skin checks with a board certified dermatologist.    [facility] Locations: [facility] [address] [place], [place] [address] [phone]  [facility] Address: [address] [place], [place], [place] [address] Phone: [phone]  [facility] [address] [place], [place], [place] Phone: [phone]  [facility] [address], [place], [place] [address] Phone: [phone]  [place]: [facility] [address], [place], [place] [address] Phone: [phone]  [place] [facility] [address] [place], [place], [place] [address] Phone: [phone]  [place]: [facility] [address] [place], [place] [address] Phone: [phone]

## 2024-02-22 ENCOUNTER — Other Ambulatory Visit: Payer: Self-pay | Admitting: Neurology

## 2024-02-22 DIAGNOSIS — G20B1 Parkinson's disease with dyskinesia, without mention of fluctuations: Secondary | ICD-10-CM

## 2024-03-03 ENCOUNTER — Other Ambulatory Visit: Payer: Self-pay | Admitting: Physician Assistant

## 2024-03-03 MED ORDER — CITALOPRAM HYDROBROMIDE 20 MG PO TABS: 20.0000 mg | ORAL_TABLET | Freq: Every day | ORAL | 0 refills | Status: DC

## 2024-04-15 ENCOUNTER — Encounter (HOSPITAL_BASED_OUTPATIENT_CLINIC_OR_DEPARTMENT_OTHER): Payer: Self-pay | Admitting: Cardiology

## 2024-04-15 ENCOUNTER — Ambulatory Visit (HOSPITAL_BASED_OUTPATIENT_CLINIC_OR_DEPARTMENT_OTHER): Admitting: Cardiology

## 2024-04-15 VITALS — BP 128/80 | HR 76 | Ht 69.0 in | Wt 266.0 lb

## 2024-04-15 DIAGNOSIS — Q225 Ebstein's anomaly: Secondary | ICD-10-CM

## 2024-04-15 DIAGNOSIS — E6609 Other obesity due to excess calories: Secondary | ICD-10-CM | POA: Diagnosis not present

## 2024-04-15 DIAGNOSIS — E66812 Obesity, class 2: Secondary | ICD-10-CM

## 2024-04-15 DIAGNOSIS — Z7189 Other specified counseling: Secondary | ICD-10-CM

## 2024-04-15 DIAGNOSIS — Z6839 Body mass index (BMI) 39.0-39.9, adult: Secondary | ICD-10-CM

## 2024-04-15 NOTE — Patient Instructions (Addendum)
 Medication Instructions:  Your physician recommends that you continue on your current medications as directed. Please refer to the Current Medication list given to you today.    *If you need a refill on your cardiac medications before your next appointment, please call your pharmacy*  Lab Work: NONE   Testing/Procedures: Your physician has requested that you have an echocardiogram. Echocardiography is a painless test that uses sound waves to create images of your heart. It provides your doctor with information about the size and shape of your heart and how well your heart's chambers and valves are working. This procedure takes approximately one hour. There are no restrictions for this procedure. Please do NOT wear cologne, perfume, aftershave, or lotions (deodorant is allowed). Please arrive 15 minutes prior to your appointment time.  Please note: We ask at that you not bring children with you during ultrasound (echo/ vascular) testing. Due to room size and safety concerns, children are not allowed in the ultrasound rooms during exams. Our front office staff cannot provide observation of children in our lobby area while testing is being conducted. An adult accompanying a patient to their appointment will only be allowed in the ultrasound room at the discretion of the ultrasound technician under special circumstances. We apologize for any inconvenience. TO BE DONE AFTER FEB 2026  Follow-Up: At [facility], you and your health needs are our priority.  As part of our continuing mission to provide you with exceptional heart care, our providers are all part of one team.  This team includes your primary Cardiologist (physician) and Advanced Practice Providers or APPs (Physician Assistants and Nurse Practitioners) who all work together to provide you with the care you need, when you need it.  Your next appointment:   12 month(s)  Provider:   Sheryle Donning, MD, Slater Duncan, NP,  or Neomi Banks, NP     We recommend signing up for the patient portal called "MyChart".  Sign up information is provided on this After Visit Summary.  MyChart is used to connect with patients for Virtual Visits (Telemedicine).  Patients are able to view lab/test results, encounter notes, upcoming appointments, etc.  Non-urgent messages can be sent to your provider as well.   To learn more about what you can do with MyChart, go to [email or web address].

## 2024-04-15 NOTE — Progress Notes (Signed)
 Cardiology Office Note:    Date:  04/15/2024   ID:  Susan Bender, DOB Mar 03, 1961, MRN [id number]  PCP:  Alexander Iba, PA  Cardiologist:  Sheryle Donning, MD PhD  History of Present Illness:    Susan Bender is a 63 y.o. female with a hx of Ebstein's anomaly, Parkinson's disease, who is seen for follow up. I initially saw her 01/25/19 as a new consult at the request of Alexander Iba, [place] for the evaluation and management of Ebstein's.  Cardiac history: Diagnosed in her 72s, no prior cardiac history. 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 Checked on an ECG, saw fast rhythm, went to see provider. Had holter monitor placed and was placed on medication by an NP. (per Dr. Donne Gage notes, Holter showed isolated PACs and PVCs, was placed on beta blocker). Felt terrible on medication, referred to Dr. Abran Abrahams in [place], [place]. Had normal stress test, then had echo that showed Ebstein's. Was referred to Dr. Richad Champagne at the [facility], has one visit there. Was followed annually by Dr. Abran Abrahams with echo every 3-5 years.   She had a Ca score 08/2022 revealing a coronary calcium score of 0.   Today: Overall doing well. Doing yoga a few times/week. Breathing is stable, no swelling. Very rare palpitations. Discussed signs/symptoms to watch for.  Discussed starting Wegovy , has been a process and ultimately was unable to get this covered without going through a [facility] provider. Decided not to pursue.   ROS:  Denies chest pain, shortness of breath at rest or with normal exertion. No PND, orthopnea, LE edema or unexpected weight gain. No syncope. ROS otherwise negative except as noted.   EKGs/Labs/Other Studies Reviewed:    EKG Interpretation Date/Time:  Friday Apr 15 2024 10:41:45 EDT Ventricular Rate:  73 PR Interval:  172 QRS Duration:  74 QT Interval:  358 QTC Calculation: 394 R Axis:   12  Text  Interpretation: Normal sinus rhythm Normal ECG Confirmed by Sheryle Donning [phone]) on 04/15/2024 11:08:44 AM    Physical Exam:    VS:  BP 128/80   Pulse 76   Ht 5\' 9"  (1.753 m)   Wt 266 lb (120.7 kg)   SpO2 98%   BMI 39.28 kg/m     Wt Readings from Last 3 Encounters:  04/15/24 266 lb (120.7 kg)  01/26/24 284 lb (128.8 kg)  09/09/23 281 lb 12.8 oz (127.8 kg)    GEN: Well nourished, well developed in no acute distress HEENT: Normal, moist mucous membranes NECK: No JVD CARDIAC: regular rhythm, normal S1 and S2, no rubs or gallops. 1/6 HS murmur. VASCULAR: Radial and DP pulses 2+ bilaterally. No carotid bruits RESPIRATORY:  Clear to auscultation without rales, wheezing or rhonchi  ABDOMEN: Soft, non-tender, non-distended MUSCULOSKELETAL:  Ambulates independently SKIN: Warm and dry, no edema NEUROLOGIC:  Alert and oriented x 3. No focal neuro deficits noted. PSYCHIATRIC:  Normal affect   PLAN:    Ebstein's anomaly: mild variant. Has not limited her. -no clinical symptoms of heart failure -Prior echo in 2006 was normal RV function. Reports last echo ~2017, I do not have a copy of this -echo 2021, due 2026. Ordered today -no history of cyanosis or paradoxical embolism -reported flow across IAS in 2006, though bubble study suggest intrapulmonary shunt. Possible PFO. -no significant arrhythmia seen on prior monitor evaluation. No delta wave on ECG.  -discussed Ebstein's, signs of  right heart failure to watch for. No evidence of this today  Class 2 obesity, BMI 39: working on lifestyle, trying to make changes to work schedule to make this more realistic for her. We have discussed GLP1RA, and she discussed with her PCP, but has had issues getting covered, elected not to pursue.  Primary prevention: -recommend heart healthy/Mediterranean diet, with whole grains, fruits, vegetable, fish, lean meats, nuts, and olive oil. Limit salt. -recommend moderate walking, 3-5 times/week for  30-50 minutes each session. Aim for at least 150 minutes.week. Goal should be pace of 3 miles/hours, or walking 1.5 miles in 30 minutes -recommend avoidance of tobacco products. Avoid excess alcohol. -ASCVD risk score: The 10-year ASCVD risk score (Arnett DK, et al., 2019) is: 4.5%   Values used to calculate the score:     Age: 23 years     Sex: Female     Is Non-Hispanic African American: No     Diabetic: No     Tobacco smoker: No     Systolic Blood Pressure: 128 mmHg     Is BP treated: No     HDL Cholesterol: 66.3 mg/dL     Total Cholesterol: 229 mg/dL   Plan for follow up: 1 year or sooner as needed.  Signed, Sheryle Donning, MD PhD 04/15/2024     [facility]

## 2024-04-19 ENCOUNTER — Ambulatory Visit: Payer: Managed Care, Other (non HMO) | Admitting: Diagnostic Neuroimaging

## 2024-05-11 ENCOUNTER — Other Ambulatory Visit: Payer: Self-pay | Admitting: Diagnostic Neuroimaging

## 2024-06-13 ENCOUNTER — Encounter: Payer: Self-pay | Admitting: Neurology

## 2024-06-13 ENCOUNTER — Other Ambulatory Visit: Payer: Self-pay

## 2024-06-13 DIAGNOSIS — G20B1 Parkinson's disease with dyskinesia, without mention of fluctuations: Secondary | ICD-10-CM

## 2024-06-13 MED ORDER — CARBIDOPA-LEVODOPA 25-100 MG PO TABS: 1.0000 | ORAL_TABLET | Freq: Three times a day (TID) | ORAL | 0 refills | Status: DC

## 2024-06-15 ENCOUNTER — Other Ambulatory Visit: Payer: Self-pay | Admitting: Physician Assistant

## 2024-07-11 NOTE — Progress Notes (Signed)
 Assessment/Plan:   1.  Parkinsons Disease with mild Parkinsons dyskinesia  -continue carbidopa/levodopa 25/100, 1 po tid.  May need more in the future.  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  R/b/se  -handicap placard filled out  -she did PF gene study and it was negative for GBA, LRRK2, SNCA, PRKN, PARK7, VPS35, PINK1.    -We discussed that it used to be thought that levodopa would increase risk of melanoma but now it is believed that Parkinsons itself likely increases risk of melanoma. she is to get regular skin checks.  Information given to dermatologist.  -We discussed vyalev and onapgo, although I do not think she needs either of those right now.  2.  Constipation  -discussed nature and pathophysiology and association with PD  -discussed importance of hydration.  Pt is to increase water intake  -pt is given a copy of the rancho recipe  -recommended daily colace  -recommended miralax prn   -add MgOxide 500mg  at bedtime  3.  Obesity  -just started zepbound.  Watch this as may contribute to #1.  She and I discussed. Subjective:   Susan Bender was seen today in follow up for Parkinsons disease.  My previous records were reviewed prior to todays visit as well as outside records available to me.  Patient is on rotigotine patch and levodopa.  She has no compulsive behaviors or sleep attacks.  She has not had falls.  No lightheadedness or near syncope.  She has started following with bariatrics at [facility] and was recently started on Zepbound.  She is doing yoga 3 d/week.    Current prescribed movement disorder medications: Carbidopa/levodopa 25/100, 1 tablet 3 times per day Rotigotine patch, 4 mg daily     ALLERGIES:   Allergies  Allergen Reactions   Sulfa Antibiotics Hives and Rash    CURRENT MEDICATIONS:  Current Meds  Medication Sig    carbidopa-levodopa (SINEMET IR) 25-100 MG tablet Take 1 tablet by mouth 3 (three) times daily before meals.   citalopram (CELEXA) 20 MG tablet Take 1 tablet (20 mg total) by mouth daily.   meloxicam (MOBIC) 7.5 MG tablet Take 1 tablet (7.5 mg total) by mouth daily.   Multiple Vitamins-Minerals (ONE-A-DAY WOMENS VITACRAVES) CHEW Chew 1 each by mouth daily.   NEUPRO 4 MG/24HR Place 1 patch onto the skin daily.   ZEPBOUND 2.5 MG/0.5ML Pen Inject 2.5 mg into the skin once a week.     Objective:   PHYSICAL EXAMINATION:    VITALS:   Vitals:   07/12/24 1453  BP: 130/82  Pulse: 82  SpO2: 98%  Weight: 263 lb (119.3 kg)     GEN:  The patient appears stated age and is in NAD. HEENT:  Normocephalic, atraumatic.  The mucous membranes are moist. The superficial temporal arteries are without ropiness or tenderness. CV:  RRR Lungs:  CTAB Neck/HEME:  There are no carotid bruits bilaterally.  Neurological examination:  Orientation: The patient is alert and oriented x3. Cranial nerves: There is good facial symmetry with mild facial hypomimia. The speech is fluent and clear. Soft palate rises symmetrically and there is no tongue deviation. Hearing is intact to conversational tone. Sensation: Sensation is intact to light touch throughout Motor: Strength is at least antigravity x4.  Movement examination: Tone: There is mild increased tone in the LUE/LLE Abnormal movements: no dyskinesia today  Coordination:  There is mild decremation with RAM's, with any form of RAMS, including alternating supination and pronation of the forearm, hand opening and closing, finger taps, heel taps and toe taps on the L.   Gait and Station: The patient has no difficulty arising out of a deep-seated chair without the use of the hands. The patient's stride length is min decreased with decreased arm swing on the L.  She is slightly forward flexed  I have reviewed and interpreted the following labs independently     Chemistry      Component Value Date/Time   NA 139 08/10/2023 0933   NA 143 10/01/2017 0000   K 4.5 08/10/2023 0933   CL 103 08/10/2023 0933   CO2 31 08/10/2023 0933   BUN 18 08/10/2023 0933   BUN 12 10/01/2017 0000   CREATININE 0.87 08/10/2023 0933   CREATININE 0.89 07/31/2020 1116   GLU 109 10/01/2017 0000      Component Value Date/Time   CALCIUM 9.8 08/10/2023 0933   ALKPHOS 86 08/10/2023 0933   AST 17 08/10/2023 0933   ALT 5 08/10/2023 0933   BILITOT 0.6 08/10/2023 0933       Lab Results  Component Value Date   WBC 4.6 08/10/2023   HGB 14.2 08/10/2023   HCT 43.5 08/10/2023   MCV 95.5 08/10/2023   PLT 234.0 08/10/2023    Lab Results  Component Value Date   TSH 1.28 10/01/2017     Total time spent on today's visit was 30 minutes, including both face-to-face time and nonface-to-face time.  Time included that spent on review of records (prior notes available to me/labs/imaging if pertinent), discussing treatment and goals, answering patient's questions and coordinating care.  Cc:  Job Lukes, PA

## 2024-07-12 ENCOUNTER — Ambulatory Visit: Payer: Managed Care, Other (non HMO) | Admitting: Neurology

## 2024-07-12 ENCOUNTER — Encounter: Payer: Self-pay | Admitting: Neurology

## 2024-07-12 VITALS — BP 130/82 | HR 82 | Wt 263.0 lb

## 2024-07-12 DIAGNOSIS — E669 Obesity, unspecified: Secondary | ICD-10-CM

## 2024-07-12 DIAGNOSIS — G20B1 Parkinson's disease with dyskinesia, without mention of fluctuations: Secondary | ICD-10-CM | POA: Diagnosis not present

## 2024-07-12 DIAGNOSIS — K5901 Slow transit constipation: Secondary | ICD-10-CM

## 2024-07-12 MED ORDER — CARBIDOPA-LEVODOPA ER 50-200 MG PO TBCR: 1.0000 | EXTENDED_RELEASE_TABLET | Freq: Every day | ORAL | 1 refills | Status: DC

## 2024-07-12 MED ORDER — ROTIGOTINE 6 MG/24HR TD PT24: 1.0000 | MEDICATED_PATCH | Freq: Every day | TRANSDERMAL | 5 refills | Status: DC

## 2024-07-12 NOTE — Patient Instructions (Addendum)
 Increase rotigitine 6 mg daily Add carbidopa /levodopa  50/200 at bed  Constipation and Parkinson's disease:  1.Rancho recipe for constipation in Parkinsons Disease:  -1 cup of unprocessed bran (need to get this at [employer], [employer] or similar type of store), 2 cups of applesauce in 1 cup of prune juice 2.  Increase fiber intake (Metamucil,vegetables) 3.  Regular, moderate exercise can be beneficial. 4.  Avoid medications causing constipation, such as medications like antacids with calcium or magnesium 5.  It's okay to take daily Miralax, and taper if stools become too loose or you experience diarrhea 6.  Stool softeners (Colace) can help with chronic constipation and I recommend you take this daily. 7.  Increase water intake.  You should be drinking 1/2 gallon of water a day as long as you have not been diagnosed with congestive heart failure or renal/kidney failure.  This is probably the single greatest thing that you can do to help your constipation.  8.  Add in MgOxide 500mg  at bedtime  SAVE THE DATE!  We are planning a Parkinsons Disease educational symposium at [facility], [address] [place], [place], [place] [address] on September 19.  We will have a movement disorder physician expert from [employer] coming to speak and a caregiver speaker.  We will have a panel of experts that will show you who you may need on your team of people on your journey with Parkinsons.  I hope to see you there!  Use this QR code to register by scanning it with the camera app on your phone:      Need more help with registration?  Contact [email or web address]

## 2024-07-17 ENCOUNTER — Other Ambulatory Visit: Payer: Self-pay | Admitting: Physician Assistant

## 2024-08-10 ENCOUNTER — Ambulatory Visit (INDEPENDENT_AMBULATORY_CARE_PROVIDER_SITE_OTHER): Payer: Managed Care, Other (non HMO) | Admitting: Physician Assistant

## 2024-08-10 ENCOUNTER — Encounter: Payer: Self-pay | Admitting: Physician Assistant

## 2024-08-10 VITALS — BP 118/70 | HR 74 | Temp 97.2°F | Ht 67.5 in | Wt 250.4 lb

## 2024-08-10 DIAGNOSIS — E669 Obesity, unspecified: Secondary | ICD-10-CM | POA: Diagnosis not present

## 2024-08-10 DIAGNOSIS — G20B1 Parkinson's disease with dyskinesia, without mention of fluctuations: Secondary | ICD-10-CM

## 2024-08-10 DIAGNOSIS — Z0000 Encounter for general adult medical examination without abnormal findings: Secondary | ICD-10-CM

## 2024-08-10 DIAGNOSIS — F419 Anxiety disorder, unspecified: Secondary | ICD-10-CM

## 2024-08-10 DIAGNOSIS — K5901 Slow transit constipation: Secondary | ICD-10-CM

## 2024-08-10 LAB — COMPREHENSIVE METABOLIC PANEL WITH GFR
ALT: 7 U/L (ref 0–35)
AST: 19 U/L (ref 0–37)
Albumin: 4.2 g/dL (ref 3.5–5.2)
Alkaline Phosphatase: 57 U/L (ref 39–117)
BUN: 17 mg/dL (ref 6–23)
CO2: 29 meq/L (ref 19–32)
Calcium: 9.8 mg/dL (ref 8.4–10.5)
Chloride: 102 meq/L (ref 96–112)
Creatinine, Ser: 1.04 mg/dL (ref 0.40–1.20)
GFR: 56.99 mL/min — ABNORMAL LOW (ref >60.00)
Glucose, Bld: 86 mg/dL (ref 70–99)
Potassium: 4.6 meq/L (ref 3.5–5.1)
Sodium: 137 meq/L (ref 135–145)
Total Bilirubin: 0.6 mg/dL (ref 0.2–1.2)
Total Protein: 6.7 g/dL (ref 6.0–8.3)

## 2024-08-10 LAB — CBC WITH DIFFERENTIAL/PLATELET
Basophils Absolute: 0 K/uL (ref 0.0–0.1)
Basophils Relative: 1.1 % (ref 0.0–3.0)
Eosinophils Absolute: 0.1 K/uL (ref 0.0–0.7)
Eosinophils Relative: 2.1 % (ref 0.0–5.0)
HCT: 41.7 % (ref 36.0–46.0)
Hemoglobin: 13.9 g/dL (ref 12.0–15.0)
Lymphocytes Relative: 33.6 % (ref 12.0–46.0)
Lymphs Abs: 1.5 K/uL (ref 0.7–4.0)
MCHC: 33.3 g/dL (ref 30.0–36.0)
MCV: 92.7 fl (ref 78.0–100.0)
Monocytes Absolute: 0.4 K/uL (ref 0.1–1.0)
Monocytes Relative: 9.2 % (ref 3.0–12.0)
Neutro Abs: 2.4 K/uL (ref 1.4–7.7)
Neutrophils Relative %: 54 % (ref 43.0–77.0)
Platelets: 185 K/uL (ref 150.0–400.0)
RBC: 4.5 Mil/uL (ref 3.87–5.11)
RDW: 13.1 % (ref 11.5–15.5)
WBC: 4.4 K/uL (ref 4.0–10.5)

## 2024-08-10 LAB — LIPID PANEL
Cholesterol: 186 mg/dL (ref 0–200)
HDL: 62.8 mg/dL (ref >39.00)
LDL Cholesterol: 114 mg/dL — ABNORMAL HIGH (ref 0–99)
NonHDL: 123.68
Total CHOL/HDL Ratio: 3
Triglycerides: 50 mg/dL (ref 0.0–149.0)
VLDL: 10 mg/dL (ref 0.0–40.0)

## 2024-08-10 LAB — HEMOGLOBIN A1C: Hgb A1c MFr Bld: 6 % (ref 4.6–6.5)

## 2024-08-10 MED ORDER — CITALOPRAM HYDROBROMIDE 20 MG PO TABS: 20.0000 mg | ORAL_TABLET | Freq: Every day | ORAL | 3 refills | Status: AC

## 2024-08-10 NOTE — Progress Notes (Signed)
 Subjective:    Susan Bender is a 63 y.o. female and is here for a comprehensive physical exam.  HPI  There are no preventive care reminders to display for this patient.  Discussed the use of AI scribe software for clinical note transcription with the patient, who gave verbal consent to proceed.  History of Present Illness Susan Bender is a 63 year old female with Parkinson's disease who presents for a follow-up visit.  She seeks a COVID-19 vaccine and has been receiving her vaccines at [facility]. She has been vaccinated annually and plans to continue this practice. She has previously received the Pfizer vaccine and intends to get her flu shot at work.  Her Parkinson's symptoms include nocturnal leg cramps, managed with time-release carbidopa  and a Neupro  patch, recently increased to 6 mg, with some improvement. She practices yoga three to four times a week.  She experiences constipation, which she attributes to Parkinson's and her medication regimen, including Zepbound for weight management. She has tried Colace, Miralax, Metamucil, and increased water intake with limited success. Her work schedule makes it challenging to increase water intake.  She takes Celexa  for anxiety, which is occasionally present but managed effectively with the medication. She is up to date with her colonoscopy, Pap smear, and has a mammogram scheduled. She is current with eye doctor visits but needs to schedule appointments with her dentist and dermatologist. She is considering retirement but is concerned about financial stability and insurance coverage.    Health Maintenance: Immunizations -- UpToDate -- will get flu shot at work and COVID vaxx when available Colonoscopy -- UpToDate; due to 2028 Mammogram -- UpToDate  PAP -- UpToDate  Bone Density -- n/a Diet -- healthy diet as able Exercise -- yoga  Sleep habits -- occasional issues but no major concerns Mood -- stable on  celexa   UTD with dentist? - no UTD with eye doctor? - yes  Weight history: Wt Readings from Last 10 Encounters:  08/10/24 250 lb 6.1 oz (113.6 kg)  07/12/24 263 lb (119.3 kg)  04/15/24 266 lb (120.7 kg)  01/26/24 284 lb (128.8 kg)  09/09/23 281 lb 12.8 oz (127.8 kg)  08/10/23 280 lb 9.6 oz (127.3 kg)  04/21/23 278 lb (126.1 kg)  04/10/23 280 lb 4.8 oz (127.1 kg)  12/15/22 281 lb (127.5 kg)  11/18/22 280 lb (127 kg)   Body mass index is 38.64 kg/m. No LMP recorded. Patient is postmenopausal.  Alcohol use:  reports current alcohol use of about 1.0 standard drink of alcohol per week.  Tobacco use:  Tobacco Use: Low Risk  (08/10/2024)   Patient History    Smoking Tobacco Use: Never    Smokeless Tobacco Use: Never    Passive Exposure: Not on file   Eligible for lung cancer screening? no     08/10/2024    8:39 AM  Depression screen PHQ 2/9  Decreased Interest 0  Down, Depressed, Hopeless 0  PHQ - 2 Score 0     Other providers/specialists: Patient Care Team: Job Lukes, [place] as PCP - General (Physician Assistant) Lonni Slain, MD as PCP - Cardiology (Cardiology)    PMHx, SurgHx, SocialHx, Medications, and Allergies were reviewed in the Visit Navigator and updated as appropriate.   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   Endometriosis    PFO (patent foramen ovale)  Past Surgical History:  Procedure Laterality Date   BREAST BIOPSY Right    BREAST EXCISIONAL BIOPSY Left    x2   JOINT REPLACEMENT     LASER LAPAROSCOPY     REPLACEMENT TOTAL HIP W/  RESURFACING IMPLANTS Left 05/2022   WRIST SURGERY Right 2019     Family History  Problem Relation Age of Onset   Arthritis Mother    Alzheimer's disease Mother    Diabetes Father    Heart attack Father    Heart disease Father    Hypertension Father    Parkinson's disease Father    COPD Sister    Diabetes Sister     Hypertension Sister    Asthma Sister    Heart attack Paternal Grandfather    Heart disease Paternal Grandfather    Breast cancer Paternal Aunt    Multiple sclerosis Cousin     Social History   Tobacco Use   Smoking status: Never   Smokeless tobacco: Never  Vaping Use   Vaping status: Never Used  Substance Use Topics   Alcohol use: Yes    Alcohol/week: 1.0 standard drink of alcohol    Types: 1 Glasses of wine per week    Comment: occassional glass of wine   Drug use: Never    Review of Systems:   Review of Systems  Constitutional:  Negative for chills, fever, malaise/fatigue and weight loss.  HENT:  Negative for hearing loss, sinus pain and sore throat.   Respiratory:  Negative for cough and hemoptysis.   Cardiovascular:  Negative for chest pain, palpitations, leg swelling and PND.  Gastrointestinal:  Negative for abdominal pain, constipation, diarrhea, heartburn, nausea and vomiting.  Genitourinary:  Negative for dysuria, frequency and urgency.  Musculoskeletal:  Negative for back pain, myalgias and neck pain.  Skin:  Negative for itching and rash.  Neurological:  Negative for dizziness, tingling, seizures and headaches.  Endo/Heme/Allergies:  Negative for polydipsia.  Psychiatric/Behavioral:  Negative for depression. The patient is not nervous/anxious.     Objective:   BP 118/70 (BP Location: Left Arm, Patient Position: Sitting, Cuff Size: Large)   Pulse 74   Temp (!) 97.2 F (36.2 C) (Temporal)   Ht 5' 7.5 (1.715 m)   Wt 250 lb 6.1 oz (113.6 kg)   SpO2 95%   BMI 38.64 kg/m  Body mass index is 38.64 kg/m.   General Appearance:    Alert, cooperative, no distress, appears stated age  Head:    Normocephalic, without obvious abnormality, atraumatic  Eyes:    PERRL, conjunctiva/corneas clear, EOM's intact, fundi    benign, both eyes  Ears:    Normal TM's and external ear canals, both ears  Nose:   Nares normal, septum midline, mucosa normal, no drainage    or  sinus tenderness  Throat:   Lips, mucosa, and tongue normal; teeth and gums normal  Neck:   Supple, symmetrical, trachea midline, no adenopathy;    thyroid:  no enlargement/tenderness/nodules; no carotid   bruit or JVD  Back:     Symmetric, no curvature, ROM normal, no CVA tenderness  Lungs:     Clear to auscultation bilaterally, respirations unlabored  Chest Wall:    No tenderness or deformity   Heart:    Regular rate and rhythm, S1 and S2 normal, no murmur, rub or gallop  Breast Exam:    Deferred  Abdomen:     Soft, non-tender, bowel sounds active all four quadrants,    no masses, no  organomegaly  Genitalia:    Deferred  Extremities:   Extremities normal, atraumatic, no cyanosis or edema  Pulses:   2+ and symmetric all extremities  Skin:   Skin color, texture, turgor normal, no rashes or lesions  Lymph nodes:   Cervical, supraclavicular, and axillary nodes normal  Neurologic:   CNII-XII intact, normal strength, sensation and reflexes    throughout    Assessment/Plan:   Assessment and Plan Assessment & Plan Comprehensive Physical Exam (CPE) preventive care annual visit Today patient counseled on age appropriate routine health concerns for screening and prevention, each reviewed and up to date or declined. Immunizations reviewed and up to date or declined. Labs ordered and reviewed. Risk factors for depression reviewed and negative. Hearing function and visual acuity are intact. ADLs screened and addressed as needed. Functional ability and level of safety reviewed and appropriate. Education, counseling and referrals performed based on assessed risks today. Patient provided with a copy of personalized plan for preventive services.  Parkinson's disease with dyskinesia Symptoms well-managed with slight progression. Negative genetic study results. - Continue Neupro  patch, increase to 6 mg. - Continue time-release carbidopa  at night. - Continue Sinemet  IR 25-100 mg three times daily. -  Encourage continued participation in yoga. - Provide prescription for COVID-19 vaccine.  Chronic constipation Exacerbated by Zepbound and Parkinson's. Bloating with Metamucil. Discussed potential use of prescription medications if symptoms persist. - Increase Miralax to two capfuls daily if needed. - Consider probiotics or Benefiber as tolerated. - Provide constipation handout for additional strategies. - Discuss prescription options with GI if symptoms persist.  Obesity Managed with Zepbound, resulting in significant weight loss. Considering dosing adjustment to manage side effects. - Continue Zepbound, consider extending dosing interval to every 10 days or two weeks.  Anxiety disorder Well-managed with Celexa . Occasional mild anxiety not significant enough to alter treatment. - Continue Celexa  20 mg daily. - Send prescription for Celexa .        Lucie Buttner, PA-C [facility]

## 2024-08-10 NOTE — Patient Instructions (Addendum)
   Constipation and Parkinson's disease:   1.Rancho recipe for constipation in Parkinsons Disease:             -1 cup of unprocessed bran (need to get this at [employer], [employer] or similar type of store), 2 cups of applesauce in 1 cup of prune juice 2.  Increase fiber intake (Metamucil,vegetables) 3.  Regular, moderate exercise can be beneficial. 4.  Avoid medications causing constipation, such as medications like antacids with calcium or magnesium 5.  It's okay to take daily Miralax, and taper if stools become too loose or you experience diarrhea 6.  Stool softeners (Colace) can help with chronic constipation and I recommend you take this daily. 7.  Increase water intake.  You should be drinking 1/2 gallon of water a day as long as you have not been diagnosed with congestive heart failure or renal/kidney failure.  This is probably the single greatest thing that you can do to help your constipation.  8.  Add in MgOxide 500mg  at bedtime   Two kiwis a day == improves constipation

## 2024-08-11 ENCOUNTER — Ambulatory Visit: Payer: Self-pay | Admitting: Physician Assistant

## 2024-08-11 DIAGNOSIS — R7989 Other specified abnormal findings of blood chemistry: Secondary | ICD-10-CM

## 2024-08-13 ENCOUNTER — Encounter: Payer: Self-pay | Admitting: Neurology

## 2024-09-05 ENCOUNTER — Other Ambulatory Visit: Payer: Self-pay | Admitting: Neurology

## 2024-09-05 DIAGNOSIS — G20B1 Parkinson's disease with dyskinesia, without mention of fluctuations: Secondary | ICD-10-CM

## 2024-09-12 ENCOUNTER — Other Ambulatory Visit (HOSPITAL_BASED_OUTPATIENT_CLINIC_OR_DEPARTMENT_OTHER): Payer: Self-pay | Admitting: Physician Assistant

## 2024-09-12 DIAGNOSIS — Z1231 Encounter for screening mammogram for malignant neoplasm of breast: Secondary | ICD-10-CM

## 2024-09-14 ENCOUNTER — Ambulatory Visit (INDEPENDENT_AMBULATORY_CARE_PROVIDER_SITE_OTHER)

## 2024-09-14 DIAGNOSIS — Z1231 Encounter for screening mammogram for malignant neoplasm of breast: Secondary | ICD-10-CM | POA: Diagnosis not present

## 2024-10-13 ENCOUNTER — Other Ambulatory Visit

## 2024-10-13 ENCOUNTER — Ambulatory Visit: Payer: Self-pay | Admitting: Physician Assistant

## 2024-10-13 DIAGNOSIS — R7989 Other specified abnormal findings of blood chemistry: Secondary | ICD-10-CM

## 2024-10-13 LAB — COMPREHENSIVE METABOLIC PANEL WITH GFR
ALT: 7 U/L (ref 0–35)
AST: 20 U/L (ref 0–37)
Albumin: 4.1 g/dL (ref 3.5–5.2)
Alkaline Phosphatase: 50 U/L (ref 39–117)
BUN: 17 mg/dL (ref 6–23)
CO2: 26 meq/L (ref 19–32)
Calcium: 9.1 mg/dL (ref 8.4–10.5)
Chloride: 105 meq/L (ref 96–112)
Creatinine, Ser: 0.86 mg/dL (ref 0.40–1.20)
GFR: 71.5 mL/min (ref >60.00)
Glucose, Bld: 80 mg/dL (ref 70–99)
Potassium: 3.9 meq/L (ref 3.5–5.1)
Sodium: 138 meq/L (ref 135–145)
Total Bilirubin: 0.7 mg/dL (ref 0.2–1.2)
Total Protein: 6.2 g/dL (ref 6.0–8.3)

## 2024-11-28 ENCOUNTER — Other Ambulatory Visit: Payer: Self-pay | Admitting: Neurology

## 2024-11-28 DIAGNOSIS — G20B1 Parkinson's disease with dyskinesia, without mention of fluctuations: Secondary | ICD-10-CM

## 2024-12-23 ENCOUNTER — Other Ambulatory Visit: Payer: Self-pay | Admitting: Neurology

## 2024-12-23 DIAGNOSIS — G20B1 Parkinson's disease with dyskinesia, without mention of fluctuations: Secondary | ICD-10-CM

## 2025-01-04 ENCOUNTER — Other Ambulatory Visit: Payer: Self-pay | Admitting: Neurology

## 2025-01-04 DIAGNOSIS — G20B1 Parkinson's disease with dyskinesia, without mention of fluctuations: Secondary | ICD-10-CM

## 2025-01-04 NOTE — Progress Notes (Unsigned)
 "   Assessment/Plan:  Assessment and Plan Assessment & Plan  Parkinson's disease with mild dyskinesia Chronic Parkinson's disease with mild, non-bothersome dyskinesia, well-controlled on current medication regimen without acute complications. - Continued carbidopa -levodopa  immediate release 25/100 mg three times daily and extended release 50/200 mg at bedtime. - Continued rotigotine  (Neupro ) patch at 6 mg/24hr daily.  Discussed considerations as she enters Medicare - Advised on management of mild patch site reactions, including rotating sites and use of lotion; recommended Flonase spray if irritation worsens. -she did PF gene study and it was negative for GBA, LRRK2, SNCA, PRKN, PARK7, VPS35, PINK1 and subsequently did the expanded gene study which was also negative - Encouraged ongoing exercise as tolerated. - Discussed ongoing participation in the progression study. - Discussed [employer] ExPDite2 stem cell trial, including requirement for brain surgery and possibility of placebo; no action taken at this time. - Reviewed and confirmed medication refills as up to date.  Constipation Chronic, multifactorial constipation related to Parkinson's disease, medications, and occupational factors, partially responsive to current regimen. - Continued daily Miralax as tolerated. - Continued magnesium and smooth move tea as needed. - Continued probiotics (gummies). - Encouraged increased water intake as feasible given work constraints.  Obesity Obesity, currently improving with significant weight loss attributed to Zepbound and lifestyle modifications; no neurologic complications. - Continued Zepbound 5 mg subcutaneous weekly. - Encouraged ongoing adherence to diet and exercise regimen.    Subjective:    Discussed the use of AI scribe software for clinical note transcription with the patient, who gave verbal consent to proceed.  History of Present Illness Susan Bender is a  64 year old right-handed female with Parkinson's disease presenting for follow-up.  Luke continues management of Parkinson's disease with Neupro  (rotigotine ) patch, recently increased to 6 mg. She remains on immediate release carbidopa /levodopa  25/100 mg three times daily and extended release carbidopa /levodopa  50/200 mg at bedtime. She reports only mild, manageable site reactions to the Neupro  patch, including occasional erythema and pruritus, which she addresses by rotating application sites and using lotion. She describes occasional involuntary leg movements, but reports they are not bothersome. She denies falls, hallucinations, or syncope. She notes left hand and foot motor impairment compared to the right. 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 She continues regular exercise, though missed nearly two weeks of classes recently due to inclement weather, and noticed a difference when she returned. She is participating in a progression study with check-ins every three months. Significant weight loss has been achieved and maintained with ongoing use of Zepbound, which she finds effective for weight management.  Chronic constipation persists, attributed to both her medications and Parkinson's disease. She manages symptoms with daily Miralax, occasional magnesium, Smooth Move tea, and recently started probiotic-containing gummies. These interventions provide partial relief, though she struggles to maintain adequate hydration due to her work environment. A close contact has also commented on her constipation.  [employer] Luke was previously diagnosed with Parkinson's disease and has been managed with carbidopa /levodopa  and Neupro  (rotigotine ) patch, with dose adjustments as needed. She has experienced mild dyskinesia and left-sided motor symptoms, which have remained stable. Expanded genetic testing revealed no pathogenic  variants. Chronic constipation has been longstanding, managed with various over-the-counter agents and dietary modifications. Obesity has been addressed with Zepbound and lifestyle changes, resulting in significant weight loss. She has not experienced major complications or adverse effects from her medication regimen.  Current prescribed movement disorder medications: Carbidopa /levodopa  25/100, 1 tablet 3 times per day carbidopa /levodopa  50/200 CR q hs (added last visit) Rotigotine  patch, 6 mg daily (increase)    ALLERGIES:   Allergies  Allergen Reactions   Sulfa Antibiotics Hives and Rash    CURRENT MEDICATIONS:  Current Meds  Medication Sig   carbidopa -levodopa  (SINEMET  CR) 50-200 MG tablet Take 1 tablet by mouth at bedtime.   carbidopa -levodopa  (SINEMET  IR) 25-100 MG tablet Take 1 tablet by mouth 3 (three) times daily before meals.   citalopram  (CELEXA ) 20 MG tablet Take 1 tablet (20 mg total) by mouth daily.   meloxicam  (MOBIC ) 7.5 MG tablet Take 1 tablet (7.5 mg total) by mouth daily.   Multiple Vitamins-Minerals (ONE-A-DAY WOMENS VITACRAVES) CHEW Chew 1 each by mouth daily.   rotigotine  (NEUPRO ) 6 MG/24HR Place 1 patch onto the skin daily.   ZEPBOUND 5 MG/0.5ML Pen Inject 5 mg into the skin once a week.     Objective:   PHYSICAL EXAMINATION:    VITALS:   Vitals:   01/06/25 0843  BP: 124/78  Pulse: 77  SpO2: 99%  Weight: 212 lb 9.6 oz (96.4 kg)   Wt Readings from Last 3 Encounters:  01/06/25 212 lb 9.6 oz (96.4 kg)  08/10/24 250 lb 6.1 oz (113.6 kg)  07/12/24 263 lb (119.3 kg)   GEN:  The patient appears stated age and is in NAD. HEENT:  Normocephalic, atraumatic.  The mucous membranes are moist. The superficial temporal arteries are without ropiness or tenderness. CV:  RRR Lungs:  CTAB Neck/HEME:  There are no carotid bruits bilaterally.  Neurological examination:  Orientation: The patient is alert and oriented x3. Cranial nerves: There is good facial  symmetry with mild facial hypomimia. The speech is fluent and clear. Soft palate rises symmetrically and there is no tongue deviation. Hearing is intact to conversational tone. Sensation: Sensation is intact to light touch throughout Motor: Strength is at least antigravity x4.  Movement examination: Tone: There is mild increased tone in the LUE/LLE Abnormal movements: mild dyskinesia LLE Coordination:  There is mild decremation with RAM's, with any form of RAMS, including alternating supination and pronation of the forearm, hand opening and closing, finger taps on the L Gait and Station: The patient has no difficulty arising out of a deep-seated chair without the use of the hands. The patient's stride length is min decreased with decreased arm swing on the L.  She is slighly antalgic.    I have reviewed and interpreted the following labs independently    Chemistry      Component Value Date/Time   NA 138 10/13/2024 0929   NA 143 10/01/2017 0000   K 3.9 10/13/2024 0929   CL 105 10/13/2024 0929   CO2 26 10/13/2024 0929   BUN 17 10/13/2024 0929   BUN 12 10/01/2017 0000   CREATININE 0.86 10/13/2024 0929   CREATININE 0.89 07/31/2020 1116   GLU 109 10/01/2017 0000      Component Value Date/Time   CALCIUM 9.1 10/13/2024 0929   ALKPHOS 50 10/13/2024 0929   AST 20 10/13/2024 0929   ALT 7 10/13/2024 0929   BILITOT 0.7 10/13/2024 0929       Lab Results  Component Value Date   WBC 4.4 08/10/2024   HGB 13.9 08/10/2024   HCT 41.7 08/10/2024   MCV 92.7 08/10/2024   PLT 185.0 08/10/2024    Lab Results  Component Value Date   TSH 1.28 10/01/2017  Total time spent on today's visit was 30 minutes, including both face-to-face time and nonface-to-face time.  Time included that spent on review of records (prior notes available to me/labs/imaging if pertinent), discussing treatment and goals, answering patient's questions and coordinating care.  Cc:  Job Lukes, PA  "

## 2025-01-06 ENCOUNTER — Ambulatory Visit: Admitting: Neurology

## 2025-01-06 ENCOUNTER — Other Ambulatory Visit (HOSPITAL_BASED_OUTPATIENT_CLINIC_OR_DEPARTMENT_OTHER)

## 2025-01-06 VITALS — BP 124/78 | HR 77 | Wt 212.6 lb

## 2025-01-06 DIAGNOSIS — K5901 Slow transit constipation: Secondary | ICD-10-CM

## 2025-01-06 DIAGNOSIS — G20B1 Parkinson's disease with dyskinesia, without mention of fluctuations: Secondary | ICD-10-CM

## 2025-01-06 DIAGNOSIS — Q225 Ebstein's anomaly: Secondary | ICD-10-CM

## 2025-01-06 DIAGNOSIS — E669 Obesity, unspecified: Secondary | ICD-10-CM

## 2025-01-06 LAB — ECHOCARDIOGRAM COMPLETE
AR max vel: 2.44 cm2
AV Area VTI: 2.35 cm2
AV Area mean vel: 2.22 cm2
AV Mean grad: 5 mmHg
AV Peak grad: 8.6 mmHg
AV Vena cont: 0.28 cm
Ao pk vel: 1.47 m/s
Area-P 1/2: 3.89 cm2
P 1/2 time: 578 ms
S' Lateral: 2.63 cm
Weight: 3401.6 [oz_av]

## 2025-01-06 NOTE — Patient Instructions (Signed)
" ° °  VISIT SUMMARY: During your neurology follow-up appointment, we reviewed the management of your Parkinson's disease, chronic constipation, and obesity. Your current medication regimen for Parkinson's disease is effective, and you are managing mild side effects well. We also discussed your ongoing participation in a progression study and potential future trials. Your chronic constipation continues to be managed with a combination of medications and lifestyle adjustments. Your significant weight loss with Zepbound and lifestyle changes is commendable, and you should continue with your current regimen.  YOUR PLAN: -PARKINSON'S DISEASE WITH MILD DYSKINESIA: Parkinson's disease is a progressive nervous system disorder that affects movement. Your condition is well-controlled with your current medications, including carbidopa /levodopa  and the Neupro  patch. Continue taking carbidopa /levodopa  immediate release 25/100 mg three times daily and extended release 50/200 mg at bedtime, and the Neupro  patch at 6 mg/24hr daily. Manage mild patch site reactions by rotating sites and using lotion, and consider Flonase spray if irritation worsens. Keep up with your exercise routine as tolerated and continue participating in the progression study. We also discussed the [place] ExPDite2 stem cell trial, which requires brain surgery and may involve a placebo, but no action is needed at this time. Your medication refills are up to date.  -CONSTIPATION: Constipation is a condition where you have difficulty emptying your bowels, often associated with hard stools. Your chronic constipation is related to Parkinson's disease, medications, and work factors. Continue using daily Miralax, magnesium, and Smooth Move tea as needed, and probiotics (gummies). Try to increase your water intake as much as possible given your work constraints.  -OBESITY: Obesity is a condition characterized by excessive body fat. You have made significant  progress with weight loss through the use of Zepbound and lifestyle modifications. Continue with Zepbound 2.5 mg/0.5 mL subcutaneous weekly and adhere to your diet and exercise regimen.  INSTRUCTIONS: Please continue with your current medication regimen and lifestyle adjustments. Follow up with your regular exercise routine and try to increase your water intake. Keep participating in the progression study and consider the [place] ExPDite 2 stem cell trial if you are interested in future treatments. Ensure your medication refills are up to date.    Contains text generated by [employer].   "

## 2025-01-26 ENCOUNTER — Telehealth: Admitting: Neurology

## 2025-07-06 ENCOUNTER — Ambulatory Visit: Payer: Self-pay | Admitting: Neurology

## 2025-08-11 ENCOUNTER — Encounter: Admitting: Physician Assistant
